# Patient Record
Sex: Male | Born: 1985 | Hispanic: No | Marital: Married | Smoking: Never smoker
Health system: Southern US, Community
[De-identification: ages and names within clinical notes are randomized; demographics above are authoritative.]

## PROBLEM LIST (undated history)

## (undated) DIAGNOSIS — D580 Hereditary spherocytosis: Secondary | ICD-10-CM

## (undated) DIAGNOSIS — I10 Essential (primary) hypertension: Secondary | ICD-10-CM

## (undated) DIAGNOSIS — F419 Anxiety disorder, unspecified: Secondary | ICD-10-CM

## (undated) DIAGNOSIS — E119 Type 2 diabetes mellitus without complications: Secondary | ICD-10-CM

## (undated) DIAGNOSIS — G4730 Sleep apnea, unspecified: Secondary | ICD-10-CM

## (undated) HISTORY — PX: UPPER GASTROINTESTINAL ENDOSCOPY: SHX188

## (undated) HISTORY — DX: Anxiety disorder, unspecified: F41.9

## (undated) HISTORY — DX: Sleep apnea, unspecified: G47.30

## (undated) HISTORY — PX: SPHINCTEROTOMY: SHX5279

## (undated) HISTORY — PX: GASTROPLASTY: SHX192

## (undated) HISTORY — PX: APPENDECTOMY: SHX54

---

## 2013-09-08 ENCOUNTER — Encounter (HOSPITAL_BASED_OUTPATIENT_CLINIC_OR_DEPARTMENT_OTHER): Payer: Self-pay | Admitting: Emergency Medicine

## 2013-09-08 ENCOUNTER — Emergency Department (HOSPITAL_BASED_OUTPATIENT_CLINIC_OR_DEPARTMENT_OTHER)
Admission: EM | Admit: 2013-09-08 | Discharge: 2013-09-08 | Disposition: A | Discharge status: Home / Self Care | Payer: PRIVATE HEALTH INSURANCE | Attending: Emergency Medicine | Admitting: Emergency Medicine

## 2013-09-08 DIAGNOSIS — Z79899 Other long term (current) drug therapy: Secondary | ICD-10-CM | POA: Insufficient documentation

## 2013-09-08 DIAGNOSIS — Z862 Personal history of diseases of the blood and blood-forming organs and certain disorders involving the immune mechanism: Secondary | ICD-10-CM | POA: Insufficient documentation

## 2013-09-08 DIAGNOSIS — K649 Unspecified hemorrhoids: Secondary | ICD-10-CM | POA: Insufficient documentation

## 2013-09-08 DIAGNOSIS — K602 Anal fissure, unspecified: Secondary | ICD-10-CM | POA: Insufficient documentation

## 2013-09-08 DIAGNOSIS — I10 Essential (primary) hypertension: Secondary | ICD-10-CM | POA: Insufficient documentation

## 2013-09-08 HISTORY — DX: Essential (primary) hypertension: I10

## 2013-09-08 HISTORY — DX: Hereditary spherocytosis: D58.0

## 2013-09-08 LAB — URINALYSIS, ROUTINE W REFLEX MICROSCOPIC
Bilirubin Urine: NEGATIVE
Hgb urine dipstick: NEGATIVE
Nitrite: NEGATIVE
Specific Gravity, Urine: 1.004 — ABNORMAL LOW (ref 1.005–1.030)
Urobilinogen, UA: 0.2 mg/dL (ref 0.0–1.0)

## 2013-09-08 LAB — GLUCOSE, CAPILLARY: Glucose-Capillary: 89 mg/dL (ref 70–99)

## 2013-09-08 MED ORDER — OXYCODONE-ACETAMINOPHEN 5-325 MG PO TABS: 1.0000 | ORAL_TABLET | Freq: Four times a day (QID) | ORAL | Status: DC | PRN

## 2013-09-08 MED ORDER — LIDOCAINE-HYDROCORTISONE ACE 3-0.5 % RE KIT: 1.0000 | PACK | Freq: Two times a day (BID) | RECTAL | Status: DC

## 2013-09-08 NOTE — ED Notes (Signed)
Pt reports he hasn't been eating and is unable to have a BM.  Rectal pain is described as severe and unbearable.  He has been using glycerin suppositories and pain medications without relief from pain.  Family remains at bedside.

## 2013-09-08 NOTE — ED Notes (Addendum)
Abdominal pain. Rectal pain. Hx of anal fissure. He is suppose to see a Careers adviser on Dec 16th for same.

## 2013-09-08 NOTE — ED Notes (Signed)
Pt d/c with instructions regarding follow up on Monday for an appt with surgeon.  Pt verbalizes understanding and instructed to return to ED for other symptoms or concerns.

## 2013-09-08 NOTE — ED Provider Notes (Signed)
CSN: [id number]     Arrival date & time 09/08/13  1644 History   First MD Initiated Contact with Patient 09/08/13 1701     Chief Complaint  Patient presents with  . Abdominal Pain   (Consider location/radiation/quality/duration/timing/severity/associated sxs/prior Treatment) HPI Comments: Pt states that he was diagnosed a month ago with an anal fissure:pt states that 2 weeks ago he saw gi and he was told that he needed to see surgery:pt states that he couldn't get in till 12/16 and the pain is worsening:denies fever, vomiting;pt states that he isn't eating much because he doesn't want to have a JX:BJYNWG is adamant that the surgeon needs to be called in today  No language interpreter was used.    Past Medical History  Diagnosis Date  . Hereditary spherocytosis   . Hypertension    Past Surgical History  Procedure Laterality Date  . Appendectomy     No family history on file. History  Substance Use Topics  . Smoking status: Never Smoker   . Smokeless tobacco: Not on file  . Alcohol Use: No    Review of Systems  Constitutional: Negative.   Respiratory: Negative.   Cardiovascular: Negative.     Allergies  Sulfa antibiotics  Home Medications   Current Outpatient Rx  Name  Route  Sig  Dispense  Refill  . telmisartan (MICARDIS) 20 MG tablet   Oral   Take 20 mg by mouth daily.         . Lidocaine-Hydrocortisone Ace 3-0.5 % KIT   Rectal   Place 1 Applicatorful rectally 2 (two) times daily.   10 each   0    BP 127/74  Pulse 78  Temp(Src) 98.2 F (36.8 C)  Resp 20  Ht 5\' 6"  (1.676 m)  Wt 169 lb (76.658 kg)  BMI 27.29 kg/m2  SpO2 100% Physical Exam  Nursing note and vitals reviewed. Constitutional: He is oriented to person, place, and time. He appears well-developed and well-nourished.  Cardiovascular: Normal rate and regular rhythm.   Pulmonary/Chest: Effort normal and breath sounds normal.  Abdominal: Soft. Bowel sounds are normal. There is no tenderness.   Genitourinary:  External exam with deflated hemorrhoids:pt refusing internal exam  Musculoskeletal: Normal range of motion.  Neurological: He is alert and oriented to person, place, and time.  Skin: Skin is warm and dry.    ED Course  Procedures (including critical care time) Labs Review Labs Reviewed  URINALYSIS, ROUTINE W REFLEX MICROSCOPIC - Abnormal; Notable for the following:    Specific Gravity, Urine 1.004 (*)    All other components within normal limits  GLUCOSE, CAPILLARY   Imaging Review No results found.  EKG Interpretation   None       MDM   1. Rectal fissure    Pt given follow up with our surgery  7:06 PM Pt sister is now back after leaving requesting a shot of pain medication:discussed with family that he would need to check in as he left the facility,and it was refused at time of admission:sister is also demanding oral toradol which i told her I don't prescribe:will give a script for pain medication  Teressa Lower, NP 09/08/13 1841  Teressa Lower, NP 09/08/13 1908

## 2013-09-08 NOTE — ED Provider Notes (Signed)
Medical screening examination/treatment/procedure(s) were performed by non-physician practitioner and as supervising physician I was immediately available for consultation/collaboration.  EKG Interpretation   None         Zelma Mazariego, MD 09/08/13 2239 

## 2013-09-18 ENCOUNTER — Ambulatory Visit (INDEPENDENT_AMBULATORY_CARE_PROVIDER_SITE_OTHER): Payer: Self-pay | Admitting: Surgery

## 2013-10-02 ENCOUNTER — Encounter (HOSPITAL_BASED_OUTPATIENT_CLINIC_OR_DEPARTMENT_OTHER): Payer: Self-pay | Admitting: Emergency Medicine

## 2013-10-02 ENCOUNTER — Emergency Department (HOSPITAL_BASED_OUTPATIENT_CLINIC_OR_DEPARTMENT_OTHER): Payer: PRIVATE HEALTH INSURANCE

## 2013-10-02 ENCOUNTER — Emergency Department (HOSPITAL_BASED_OUTPATIENT_CLINIC_OR_DEPARTMENT_OTHER)
Admission: EM | Admit: 2013-10-02 | Discharge: 2013-10-02 | Disposition: A | Discharge status: Home / Self Care | Payer: PRIVATE HEALTH INSURANCE | Attending: Emergency Medicine | Admitting: Emergency Medicine

## 2013-10-02 DIAGNOSIS — W2209XA Striking against other stationary object, initial encounter: Secondary | ICD-10-CM | POA: Insufficient documentation

## 2013-10-02 DIAGNOSIS — S92911A Unspecified fracture of right toe(s), initial encounter for closed fracture: Secondary | ICD-10-CM

## 2013-10-02 DIAGNOSIS — S92919A Unspecified fracture of unspecified toe(s), initial encounter for closed fracture: Secondary | ICD-10-CM | POA: Insufficient documentation

## 2013-10-02 DIAGNOSIS — Y929 Unspecified place or not applicable: Secondary | ICD-10-CM | POA: Insufficient documentation

## 2013-10-02 DIAGNOSIS — I10 Essential (primary) hypertension: Secondary | ICD-10-CM | POA: Insufficient documentation

## 2013-10-02 DIAGNOSIS — Y939 Activity, unspecified: Secondary | ICD-10-CM | POA: Insufficient documentation

## 2013-10-02 DIAGNOSIS — Z862 Personal history of diseases of the blood and blood-forming organs and certain disorders involving the immune mechanism: Secondary | ICD-10-CM | POA: Insufficient documentation

## 2013-10-02 DIAGNOSIS — Z79899 Other long term (current) drug therapy: Secondary | ICD-10-CM | POA: Insufficient documentation

## 2013-10-02 MED ORDER — HYDROCODONE-ACETAMINOPHEN 5-325 MG PO TABS: 2.0000 | ORAL_TABLET | ORAL | Status: DC | PRN

## 2013-10-02 NOTE — ED Provider Notes (Signed)
CSN: [id number]     Arrival date & time 10/02/13  2017 History   First MD Initiated Contact with Patient 10/02/13 2309     Chief Complaint  Patient presents with  . Foot Injury   (Consider location/radiation/quality/duration/timing/severity/associated sxs/prior Treatment) Patient is a 27 y.o. male presenting with foot injury. The history is provided by the patient. No language interpreter was used.  Foot Injury Location:  Foot and toe Toe location:  R little toe Pain details:    Quality:  Aching   Radiates to:  Does not radiate   Timing:  Constant   Progression:  Worsening Chronicity:  New Dislocation: no   Foreign body present:  No foreign bodies Prior injury to area:  Yes Worsened by:  Nothing tried Ineffective treatments:  None tried   Past Medical History  Diagnosis Date  . Hereditary spherocytosis   . Hypertension    Past Surgical History  Procedure Laterality Date  . Appendectomy     No family history on file. History  Substance Use Topics  . Smoking status: Never Smoker   . Smokeless tobacco: Not on file  . Alcohol Use: No    Review of Systems  Musculoskeletal: Positive for joint swelling and myalgias.  All other systems reviewed and are negative.    Allergies  Sulfa antibiotics  Home Medications   Current Outpatient Rx  Name  Route  Sig  Dispense  Refill  . Lidocaine-Hydrocortisone Ace 3-0.5 % KIT   Rectal   Place 1 Applicatorful rectally 2 (two) times daily.   10 each   0   . oxyCODONE-acetaminophen (PERCOCET/ROXICET) 5-325 MG per tablet   Oral   Take 1-2 tablets by mouth every 6 (six) hours as needed for severe pain.   6 tablet   0   . telmisartan (MICARDIS) 20 MG tablet   Oral   Take 20 mg by mouth daily.          BP 138/75  Pulse 95  Temp(Src) 98.7 F (37.1 C) (Oral)  Resp 16  Ht 5\' 6"  (1.676 m)  Wt 169 lb (76.658 kg)  BMI 27.29 kg/m2  SpO2 100% Physical Exam  Nursing note and vitals reviewed. Constitutional: He is  oriented to person, place, and time. He appears well-developed and well-nourished.  HENT:  Head: Normocephalic.  Musculoskeletal: He exhibits tenderness.  Tender right 5th toe  Neurological: He is alert and oriented to person, place, and time. He has normal reflexes.  Skin: Skin is warm.  Psychiatric: He has a normal mood and affect.    ED Course  Procedures (including critical care time) Labs Review Labs Reviewed - No data to display Imaging Review Dg Foot Complete Right  10/02/2013   CLINICAL DATA:  Right 5th toe pain  EXAM: RIGHT FOOT COMPLETE - 3+ VIEW  COMPARISON:  None.  FINDINGS: There is a nondisplaced fracture of the base of the right 5th proximal phalanx. There is no other fracture or dislocation. There is mild soft tissue swelling over the 5th MTP joint.  IMPRESSION: Nondisplaced fracture of the base of the 5th proximal phalanx of the right foot.   Electronically Signed   By: Elige Ko   On: 10/02/2013 22:14    EKG Interpretation   None       MDM   1. Phalanx fracture, foot, right, closed, initial encounter    Buddy tape  Post op shoe,  Hydrocodone    Elson Areas, PA-C 10/02/13 2325

## 2013-10-02 NOTE — ED Notes (Signed)
Kicked wall 12/26-c/o pain to toes 2-5-worse to 5th

## 2013-10-03 NOTE — ED Provider Notes (Signed)
Medical screening examination/treatment/procedure(s) were performed by non-physician practitioner and as supervising physician I was immediately available for consultation/collaboration.  EKG Interpretation   None         Hanley Seamen, MD 10/03/13 [phone]

## 2014-05-11 DIAGNOSIS — D580 Hereditary spherocytosis: Secondary | ICD-10-CM | POA: Insufficient documentation

## 2014-05-11 DIAGNOSIS — I10 Essential (primary) hypertension: Secondary | ICD-10-CM | POA: Insufficient documentation

## 2015-05-23 DIAGNOSIS — R002 Palpitations: Secondary | ICD-10-CM | POA: Insufficient documentation

## 2015-05-24 DIAGNOSIS — R000 Tachycardia, unspecified: Secondary | ICD-10-CM | POA: Insufficient documentation

## 2015-07-05 ENCOUNTER — Encounter (HOSPITAL_BASED_OUTPATIENT_CLINIC_OR_DEPARTMENT_OTHER): Payer: Self-pay | Admitting: *Deleted

## 2015-07-05 ENCOUNTER — Emergency Department (HOSPITAL_BASED_OUTPATIENT_CLINIC_OR_DEPARTMENT_OTHER)
Admission: EM | Admit: 2015-07-05 | Discharge: 2015-07-05 | Disposition: A | Discharge status: Home / Self Care | Payer: Worker's Compensation | Attending: Emergency Medicine | Admitting: Emergency Medicine

## 2015-07-05 DIAGNOSIS — S199XXA Unspecified injury of neck, initial encounter: Secondary | ICD-10-CM | POA: Diagnosis present

## 2015-07-05 DIAGNOSIS — Z79899 Other long term (current) drug therapy: Secondary | ICD-10-CM | POA: Diagnosis not present

## 2015-07-05 DIAGNOSIS — Z862 Personal history of diseases of the blood and blood-forming organs and certain disorders involving the immune mechanism: Secondary | ICD-10-CM | POA: Diagnosis not present

## 2015-07-05 DIAGNOSIS — Y9289 Other specified places as the place of occurrence of the external cause: Secondary | ICD-10-CM | POA: Insufficient documentation

## 2015-07-05 DIAGNOSIS — I10 Essential (primary) hypertension: Secondary | ICD-10-CM | POA: Diagnosis not present

## 2015-07-05 DIAGNOSIS — X58XXXA Exposure to other specified factors, initial encounter: Secondary | ICD-10-CM | POA: Insufficient documentation

## 2015-07-05 DIAGNOSIS — S161XXA Strain of muscle, fascia and tendon at neck level, initial encounter: Secondary | ICD-10-CM | POA: Diagnosis not present

## 2015-07-05 DIAGNOSIS — Y990 Civilian activity done for income or pay: Secondary | ICD-10-CM | POA: Insufficient documentation

## 2015-07-05 DIAGNOSIS — Y9389 Activity, other specified: Secondary | ICD-10-CM | POA: Diagnosis not present

## 2015-07-05 MED ORDER — OXYCODONE-ACETAMINOPHEN 5-325 MG PO TABS: 2.0000 | ORAL_TABLET | ORAL | Status: DC | PRN

## 2015-07-05 MED ORDER — KETOROLAC TROMETHAMINE 30 MG/ML IJ SOLN
60.0000 mg | Freq: Once | INTRAMUSCULAR | Status: AC
  Administered 2015-07-05: 60 mg via INTRAMUSCULAR
  Filled 2015-07-05: qty 2

## 2015-07-05 MED ORDER — CYCLOBENZAPRINE HCL 10 MG PO TABS: 10.0000 mg | ORAL_TABLET | Freq: Two times a day (BID) | ORAL | Status: DC | PRN

## 2015-07-05 NOTE — ED Notes (Signed)
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 C/o pain is greater on right side and hard to turn head. Pt states he passed out this am and woken up by family.

## 2015-07-05 NOTE — Discharge Instructions (Signed)
Take 600 mg of ibuprofen every 6 hours.   Soft Tissue Injury of the Neck A soft tissue injury of the neck may be either blunt or penetrating. A blunt injury does not break the skin. A penetrating injury breaks the skin, creating an open wound. Blunt injuries may happen in several ways. Most involve some type of direct blow to the neck. This can cause serious injury to the windpipe, voice box, cervical spine, or esophagus. In some cases, the injury to the soft tissue can also result in a break (fracture) of the cervical spine.  Soft tissue injuries of the neck require immediate medical care. Sometimes, you may not notice the signs of injury right away. You may feel fine at first, but the swelling may eventually close off your airway. This could result in a significant or life-threatening injury. This is rare, but it is important to keep in mind with any injury to the neck.  CAUSES  Causes of blunt injury may include:  "Clothesline" injuries. This happens when someone is moving at high speed and runs into a clothesline, outstretched arm, or similar object. This results in a direct injury to the front of the neck. If the airway is blocked, it can cause suffocation due to lack of oxygen (asphyxiation) or even instant death.  High-energy trauma. This includes injuries from motor vehicle crashes, falling from a great height, or heavy objects falling onto the neck.  Sports-related injuries. Injury to the windpipe and voice box can result from being struck by another player or being struck by an object, such as a baseball, hockey stick, or an outstretched arm.  Strangulation. This type of injury may cause skin trauma, hoarseness of voice, or broken cartilage in the voice box or windpipe. It may also cause a serious airway problem. SYMPTOMS   Bruising.  Pain and tenderness in the neck.  Swelling of the neck and face.  Hoarseness of voice.  Pain or difficulty with swallowing.  Drooling or inability  to swallow.  Trouble breathing. This may become worse when lying flat.  Coughing up blood.  High-pitched, harsh, vibratory noise due to partial obstruction of the windpipe (stridor).  Swelling of the upper arms.  Windpipe that appears to be pushed off to one side.  Air in the tissues under the skin of the neck or chest (subcutaneous emphysema). This usually indicates a problem with the normal airway and is a medical emergency. DIAGNOSIS   If possible, your caregiver may ask about the details of how the injury occurred. A detailed exam can help to identify specific areas of the neck that are injured.  Your caregiver may ask for tests to rule out injury of the voice box, airway, or esophagus. This may include X-rays, ultrasounds, CT scans, or MRI scans, depending on the severity of your injury. TREATMENT  If you have an injury to your windpipe or voice box, immediate medical care is required. In almost all cases, hospitalization is necessary. For injuries that do not appear to require surgery, it is helpful to have medical observation for 24 hours. You may be asked to do one or more of the following:  Rest your voice.  Bed rest.  Limit your diet, depending on the extent of the injury. Follow your caregiver's dietary guidelines. Often, only fluids and soft foods are recommended.  Keep your head raised.  Breathe humidified air.  Take medicines to control infection, reduce swelling, and reduce normal stomach acid. You may also need pain medicine, depending  on your injury. For injuries that appear to require surgery, you will need to stay in the hospital. The exact type of procedure needed will depend on your exact injury or injuries.  HOME CARE INSTRUCTIONS   If the skin was broken, keep the wound area clean and dry. Wear your bandage (dressing) and care for your wound as instructed.  Follow your caregiver's advice about your diet.  Follow your caregiver's advice about use of your  voice.  Take medicines as directed.  Keep your head and neck at least partially raised (elevated) while recovering. This should also be done while sleeping. SEEK MEDICAL CARE IF:   Your voice becomes weaker.  Your swelling or bruising is not improving as expected. Typically, this takes several days to improve.  You feel that you are having problems with medicines prescribed.  You have drainage from the injury site. This may be a sign that your wound is not healing properly or is infected.  You develop increasing pain or difficulty while swallowing.  You develop an oral temperature of 102 F (38.9 C) or higher. SEEK IMMEDIATE MEDICAL CARE IF:   You cough up blood.  You develop sudden trouble breathing.  You cannot tolerate your oral medicines, or you are unable to swallow.  You develop drooling.  You have new or worsening vomiting.  You develop sudden, new swelling of the neck or face.  You have an oral temperature above 102 F (38.9 C), not controlled by medicine. MAKE SURE YOU:  Understand these instructions.  Will watch your condition.  Will get help right away if you are not doing well or get worse. Document Released: 12/29/2007 Document Revised: 12/14/2011 Document Reviewed: 12/08/2010 [facility] Patient Information 2015 [place], [place]. This information is not intended to replace advice given to you by your health care provider. Make sure you discuss any questions you have with your health care provider.

## 2015-07-05 NOTE — ED Provider Notes (Signed)
CSN: [id number]     Arrival date & time 07/05/15  0813 History   First MD Initiated Contact with Patient 07/05/15 [phone]     Chief Complaint  Patient presents with  . Neck Pain      HPI Patient presents with pain to the right neck area which he thinks occurred when he was lifting someone at work.  The pain is worsened when he turns his head especially to the left.  Patient passed out this morning because the pain.  Patient has had several episodes of syncope related pain during his life and this is not unusual for him.  No recent history of trauma to the neck. Past Medical History  Diagnosis Date  . Hereditary spherocytosis   . Hypertension    Past Surgical History  Procedure Laterality Date  . Appendectomy     No family history on file. Social History  Substance Use Topics  . Smoking status: Never Smoker   . Smokeless tobacco: None  . Alcohol Use: No    Review of Systems  All other systems reviewed and are negative  Allergies  Sulfa antibiotics  Home Medications   Prior to Admission medications   Medication Sig Start Date End Date Taking? Authorizing Provider  telmisartan (MICARDIS) 20 MG tablet Take 20 mg by mouth daily.   Yes Historical Provider, MD  cyclobenzaprine (FLEXERIL) 10 MG tablet Take 1 tablet (10 mg total) by mouth 2 (two) times daily as needed for muscle spasms. 07/05/15   Nelva Nay, MD  oxyCODONE-acetaminophen (PERCOCET/ROXICET) 5-325 MG tablet Take 2 tablets by mouth every 4 (four) hours as needed for severe pain. 07/05/15   Nelva Nay, MD   BP 122/78 mmHg  Pulse 75  Temp(Src) 98.2 F (36.8 C) (Oral)  Resp 18  Ht  (1.676 m)  Wt 171 lb (77.565 kg)  BMI 27.61 kg/m2  SpO2 100% Physical Exam Physical Exam  Nursing note and vitals reviewed. Constitutional: He is oriented to person, place, and time. He appears well-developed and well-nourished. No distress.  HENT:  Head: Normocephalic and atraumatic.  Eyes: Pupils are equal, round, and  reactive to light.  Neck: patient has decreased range of motion with pain to movement and palpation on the right cervical area extending in to the right posterior shoulder.  Auscultation reveals no bruits..  Cardiovascular: Normal rate and intact distal pulses.   Pulmonary/Chest: No respiratory distress.  Abdominal: Normal appearance. He exhibits no distension.  Musculoskeletal: Normal range of motion.  Neurological: He is alert and oriented to person, place, and time. No cranial nerve deficit.  Skin: Skin is warm and dry. No rash noted.  Psychiatric: He has a normal mood and affect. His behavior is normal.   ED Course  Procedures (including critical care time) Labs Review Labs Reviewed - No data to display  Imaging Review No results found. I have personally reviewed and evaluated these images and lab results as part of my medical decision-making.    MDM   Final diagnoses:  Cervical strain, initial encounter        Nelva Nay, MD 07/05/15 [phone]

## 2015-07-06 ENCOUNTER — Emergency Department (HOSPITAL_BASED_OUTPATIENT_CLINIC_OR_DEPARTMENT_OTHER)
Admission: EM | Admit: 2015-07-06 | Discharge: 2015-07-06 | Disposition: A | Discharge status: Home / Self Care | Payer: Worker's Compensation | Attending: Emergency Medicine | Admitting: Emergency Medicine

## 2015-07-06 ENCOUNTER — Encounter (HOSPITAL_BASED_OUTPATIENT_CLINIC_OR_DEPARTMENT_OTHER): Payer: Self-pay | Admitting: Emergency Medicine

## 2015-07-06 DIAGNOSIS — M62838 Other muscle spasm: Secondary | ICD-10-CM | POA: Diagnosis not present

## 2015-07-06 DIAGNOSIS — R202 Paresthesia of skin: Secondary | ICD-10-CM | POA: Diagnosis not present

## 2015-07-06 DIAGNOSIS — Z7952 Long term (current) use of systemic steroids: Secondary | ICD-10-CM | POA: Insufficient documentation

## 2015-07-06 DIAGNOSIS — R200 Anesthesia of skin: Secondary | ICD-10-CM | POA: Insufficient documentation

## 2015-07-06 DIAGNOSIS — M25511 Pain in right shoulder: Secondary | ICD-10-CM | POA: Diagnosis present

## 2015-07-06 DIAGNOSIS — I10 Essential (primary) hypertension: Secondary | ICD-10-CM | POA: Diagnosis not present

## 2015-07-06 DIAGNOSIS — Z862 Personal history of diseases of the blood and blood-forming organs and certain disorders involving the immune mechanism: Secondary | ICD-10-CM | POA: Diagnosis not present

## 2015-07-06 DIAGNOSIS — Z79899 Other long term (current) drug therapy: Secondary | ICD-10-CM | POA: Diagnosis not present

## 2015-07-06 DIAGNOSIS — M542 Cervicalgia: Secondary | ICD-10-CM | POA: Diagnosis not present

## 2015-07-06 MED ORDER — HYDROMORPHONE HCL 1 MG/ML IJ SOLN
1.0000 mg | Freq: Once | INTRAMUSCULAR | Status: DC
  Filled 2015-07-06: qty 1

## 2015-07-06 MED ORDER — DIAZEPAM 5 MG PO TABS: 5.0000 mg | ORAL_TABLET | Freq: Three times a day (TID) | ORAL | Status: DC | PRN

## 2015-07-06 MED ORDER — DIAZEPAM 5 MG PO TABS
5.0000 mg | ORAL_TABLET | Freq: Once | ORAL | Status: AC
  Administered 2015-07-06: 5 mg via ORAL
  Filled 2015-07-06: qty 1

## 2015-07-06 NOTE — Discharge Instructions (Signed)

## 2015-07-06 NOTE — ED Provider Notes (Signed)
CSN: [id number]     Arrival date & time 07/06/15  1955 History  By signing my name below, I, Clarence Hines, attest that this documentation has been prepared under the direction and in the presence of Elwin Mocha, MD. Electronically Signed: Phillis Hines, ED Scribe. 07/06/2015. 9:06 PM.  Chief Complaint  Patient presents with  . Shoulder Pain   Patient is a 29 y.o. male presenting with shoulder pain. The history is provided by the patient. No language interpreter was used.  Shoulder Pain Location:  Shoulder Time since incident:  2 days Injury: yes   Mechanism of injury comment:  Lifting pt at work Shoulder location:  R shoulder Pain details:    Quality:  Sharp and shooting   Radiates to:  R arm   Duration:  2 days Chronicity:  New Dislocation: no   Foreign body present:  No foreign bodies Tetanus status:  Unknown Prior injury to area:  No Ineffective treatments:  Narcotics and muscle relaxant Associated symptoms: decreased range of motion, neck pain and tingling   Associated symptoms: no muscle weakness and no numbness    HPI Comments: Clarence Hines is a 29 y.o. Male with hx of HTN and hereditary spherocytosis who presents to the Emergency Department complaining of radiating, severe right shoulder pain onset one day ago. 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 States that he woke up and then passed out due to the pain. He was seen yesterday and was given Tramadol, Oxycodone, Prednisone and Flexeril to some relief. He states that he went to [facility] after his visit here for a CT scan to be performed. He states that the pain now radiates down his arms and is experiencing tingling in his 3rd, 4th and 5th fingers. Reports relieving pain with turning head to the right, and reports worsening pain with turning head to left. Pt states that he was told to have an MRI performed. Denies fall or other injuries.   Past Medical History  Diagnosis Date  .  Hereditary spherocytosis (HCC)   . Hypertension    Past Surgical History  Procedure Laterality Date  . Appendectomy     History reviewed. No pertinent family history. Social History  Substance Use Topics  . Smoking status: Never Smoker   . Smokeless tobacco: None  . Alcohol Use: No    Review of Systems  Musculoskeletal: Positive for arthralgias and neck pain.  Neurological: Positive for numbness (tingling down right arm). Negative for weakness.  All other systems reviewed and are negative.  Allergies  Sulfa antibiotics  Home Medications   Prior to Admission medications   Medication Sig Start Date End Date Taking? Authorizing Provider  predniSONE (DELTASONE) 10 MG tablet Take 10 mg by mouth daily with breakfast.   Yes Historical Provider, MD  cyclobenzaprine (FLEXERIL) 10 MG tablet Take 1 tablet (10 mg total) by mouth 2 (two) times daily as needed for muscle spasms. 07/05/15   Nelva Nay, MD  oxyCODONE-acetaminophen (PERCOCET/ROXICET) 5-325 MG tablet Take 2 tablets by mouth every 4 (four) hours as needed for severe pain. 07/05/15   Nelva Nay, MD  telmisartan (MICARDIS) 20 MG tablet Take 20 mg by mouth daily.    Historical Provider, MD   BP 154/90 mmHg  Pulse 80  Temp(Src) 98.6 F (37 C) (Oral)  Resp 20  SpO2 100% Physical Exam  Constitutional: He is oriented to person, place, and time. He appears well-developed and well-nourished. No distress.  HENT:  Head:  Normocephalic and atraumatic.  Mouth/Throat: No oropharyngeal exudate.  Eyes: EOM are normal. Pupils are equal, round, and reactive to light.  Neck: Normal range of motion. Neck supple.  Cardiovascular: Normal rate and regular rhythm.  Exam reveals no friction rub.   No murmur heard. Pulmonary/Chest: Effort normal and breath sounds normal. No respiratory distress. He has no wheezes. He has no rales.  Abdominal: He exhibits no distension. There is no tenderness. There is no rebound.  Musculoskeletal: Normal  range of motion. He exhibits no edema.       Back:  Neurological: He is alert and oriented to person, place, and time.  Skin: He is not diaphoretic.    ED Course  Procedures (including critical care time) DIAGNOSTIC STUDIES: Oxygen Saturation is 100% on RA, normal by my interpretation.    COORDINATION OF CARE: 9:19 PM-Discussed treatment plan which includes Valium with pt at bedside and pt agreed to plan.   Labs Review Labs Reviewed - No data to display  Imaging Review No results found.    EKG Interpretation None      MDM   Final diagnoses:  Muscle spasm    29 year old male here with right shoulder pain. He was [facility] yesterday and was told he needed an MRI. He thought he began that MRI here. 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 He's had some numbness and tingling in his right hand. He's neurovascularly intact, he has sensation but there is some mild tingling. On shoulder exam he has severe spasm in his right trapezius extending from the neck all the way out to the lateral edge of the trapezius. Given Valium and Dilaudid with much improving his pain. I do not think he needs an MRI emergently as the spasm is likely causing all the tingling. Stable for discharge.   I personally performed the services described in this documentation, which was scribed in my presence. The recorded information has been reviewed and is accurate.     Elwin Mocha, MD 07/07/15 [phone]

## 2015-07-06 NOTE — ED Notes (Signed)
Family specifically asking about MRI availability/ capability.

## 2015-07-06 NOTE — ED Notes (Signed)
Confirmed/ clarified with pt and family. There are no active orders for this pt here at this facility. They called earlier about MRI, was instructed that MRI staff was present on campus until 1500, pt/family arrived after MRI staff have left. Clarified that there are still no orders for MRI and that it would be up to the doctor to order it, but that MRI is not available at this time. Asking about be transferred. Process explained.

## 2015-07-06 NOTE — ED Notes (Signed)
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

## 2015-07-08 ENCOUNTER — Encounter: Payer: Self-pay | Admitting: Family Medicine

## 2015-07-08 ENCOUNTER — Ambulatory Visit (INDEPENDENT_AMBULATORY_CARE_PROVIDER_SITE_OTHER): Payer: Worker's Compensation | Admitting: Family Medicine

## 2015-07-08 VITALS — BP 140/84 | HR 103 | Ht 66.0 in | Wt 171.0 lb

## 2015-07-08 DIAGNOSIS — S199XXA Unspecified injury of neck, initial encounter: Secondary | ICD-10-CM | POA: Diagnosis not present

## 2015-07-08 NOTE — Patient Instructions (Signed)
You have cervical radiculopathy (a pinched nerve in the neck typically from a disc herniation). Continue your prednisone until it's completed Can take the flexeril for spasms, either the norco or percocet for severe pain We will go ahead with an MRI Consider a cervical collar. Heat 15 minutes at a time 3-4 times a day to help with spasms. Watch head position when on computers, texting, when sleeping in bed - should in line with back to prevent further nerve traction and irritation. I will call you the business day following the MRI to go over results and next steps.

## 2015-07-09 DIAGNOSIS — M5000 Cervical disc disorder with myelopathy, unspecified cervical region: Secondary | ICD-10-CM | POA: Insufficient documentation

## 2015-07-09 DIAGNOSIS — S199XXA Unspecified injury of neck, initial encounter: Secondary | ICD-10-CM | POA: Insufficient documentation

## 2015-07-09 NOTE — Progress Notes (Signed)
PCP: No PCP Per Patient  Subjective:   HPI: Patient is a 29 y.o. male here for neck injury.  Patient reports on 9/29 he lifted a patient and felt pain more on the right side of posterior neck as his right arm and side were jerked forward when patient resisted. Pain initially mild but later that shift pain slowly progressed. Pain level 6/10. Sharp pain associated with numbness into thumb and index fingers. Has been to a couple different EDs - radiographs and CTs negative for acute fracture. Has tried flexeril, ibuprofen, oxycodone, prednisone injection and oral prednisone without much benefit. Weakness in right arm. Difficulty holding items. Right handed. No skin changes, fever.  Past Medical History  Diagnosis Date  . Hereditary spherocytosis (HCC)   . Hypertension     Current Outpatient Prescriptions on File Prior to Visit  Medication Sig Dispense Refill  . cyclobenzaprine (FLEXERIL) 10 MG tablet Take 1 tablet (10 mg total) by mouth 2 (two) times daily as needed for muscle spasms. 20 tablet 0  . oxyCODONE-acetaminophen (PERCOCET/ROXICET) 5-325 MG tablet Take 2 tablets by mouth every 4 (four) hours as needed for severe pain. 20 tablet 0  . predniSONE (DELTASONE) 10 MG tablet Take 10 mg by mouth daily with breakfast.    . telmisartan (MICARDIS) 20 MG tablet Take 20 mg by mouth daily.     No current facility-administered medications on file prior to visit.    Past Surgical History  Procedure Laterality Date  . Appendectomy      Allergies  Allergen Reactions  . Sulfa Antibiotics     unknown    Social History   Social History  . Marital Status: Married    Spouse Name: N/A  . Number of Children: N/A  . Years of Education: N/A   Occupational History  . Not on file.   Social History Main Topics  . Smoking status: Never Smoker   . Smokeless tobacco: Not on file  . Alcohol Use: No  . Drug Use: No  . Sexual Activity: Not on file   Other Topics Concern  . Not on  file   Social History Narrative    No family history on file.  BP 140/84 mmHg  Pulse 103  Ht  (1.676 m)  Wt 171 lb (77.565 kg)  BMI 27.61 kg/m2  Review of Systems: See HPI above.    Objective:  Physical Exam:  Gen: NAD  Neck: No gross deformity, swelling, bruising. TTP right cervical paraspinal region, trapezius.  No midline/bony TTP. FROM neck - pain on flexion and left > right lateral rotation. BUE strength 5/5 except 4+/5 right finger abduction, 5-/5 right wrist extension.   Sensation diminished right index finger Trace right biceps vs 2+ left biceps reflex.  2+ bilateral reflexes in triceps, brachioradialis tendons. Negative spurlings.  Right shoulder: No swelling, ecchymoses.  No gross deformity. No TTP. FROM. Negative Hawkins, Neers. Negative Speeds, Yergasons. Strength 5/5 with empty can and resisted internal/external rotation. Negative apprehension. NV intact distally.    Assessment & Plan:  1. Neck injury - occurred at work.  Consistent with cervical radiculopathy, less likely brachial plexopathy.  Unfortunately despite several conservative measures including prednisone he has not been improving.  He has weakness, decreased reflexes and sensation, and suggestion of C6 or C7 radiculopathy by distribution.  Concern about large disc herniation - will go ahead with MRI to further assess.  Consider ESI vs neurosurgery referral based on results.

## 2015-07-09 NOTE — Assessment & Plan Note (Signed)
occurred at work.  Consistent with cervical radiculopathy, less likely brachial plexopathy.  Unfortunately despite several conservative measures including prednisone he has not been improving.  He has weakness, decreased reflexes and sensation, and suggestion of C6 or C7 radiculopathy by distribution.  Concern about large disc herniation - will go ahead with MRI to further assess.  Consider ESI vs neurosurgery referral based on results.

## 2015-07-09 NOTE — Addendum Note (Signed)
Addended by: Kathi Simpers F on: 07/09/2015 09:15 AM   Modules accepted: Orders

## 2015-07-11 ENCOUNTER — Ambulatory Visit (INDEPENDENT_AMBULATORY_CARE_PROVIDER_SITE_OTHER): Payer: Worker's Compensation | Admitting: Family Medicine

## 2015-07-11 ENCOUNTER — Other Ambulatory Visit: Payer: Self-pay | Admitting: Family Medicine

## 2015-07-11 ENCOUNTER — Encounter: Payer: Self-pay | Admitting: Family Medicine

## 2015-07-11 VITALS — BP 122/85 | HR 85 | Ht 66.0 in | Wt 170.0 lb

## 2015-07-11 DIAGNOSIS — M5412 Radiculopathy, cervical region: Secondary | ICD-10-CM

## 2015-07-11 DIAGNOSIS — S199XXD Unspecified injury of neck, subsequent encounter: Secondary | ICD-10-CM

## 2015-07-11 MED ORDER — TIZANIDINE HCL 4 MG PO TABS: 4.0000 mg | ORAL_TABLET | Freq: Three times a day (TID) | ORAL | Status: DC | PRN

## 2015-07-11 MED ORDER — PREGABALIN 75 MG PO CAPS: 75.0000 mg | ORAL_CAPSULE | Freq: Two times a day (BID) | ORAL | Status: DC

## 2015-07-11 NOTE — Patient Instructions (Signed)
Follow up with me when you return from [place]. Take lyrica twice a day. Zanaflex (tizanidine) up to three times a day for spasms - can make you sleepy. If your pain is more than 50% improved by Monday I would cancel the shot - if it isn't, go ahead with the injection (I suspect you will need to go ahead with the shot).

## 2015-07-13 ENCOUNTER — Emergency Department (HOSPITAL_BASED_OUTPATIENT_CLINIC_OR_DEPARTMENT_OTHER)
Admission: EM | Admit: 2015-07-13 | Discharge: 2015-07-14 | Disposition: A | Discharge status: Home / Self Care | Payer: Worker's Compensation | Attending: Emergency Medicine | Admitting: Emergency Medicine

## 2015-07-13 ENCOUNTER — Encounter (HOSPITAL_BASED_OUTPATIENT_CLINIC_OR_DEPARTMENT_OTHER): Payer: Self-pay | Admitting: Emergency Medicine

## 2015-07-13 DIAGNOSIS — M542 Cervicalgia: Secondary | ICD-10-CM | POA: Diagnosis present

## 2015-07-13 DIAGNOSIS — Z7952 Long term (current) use of systemic steroids: Secondary | ICD-10-CM | POA: Diagnosis not present

## 2015-07-13 DIAGNOSIS — I10 Essential (primary) hypertension: Secondary | ICD-10-CM | POA: Diagnosis not present

## 2015-07-13 DIAGNOSIS — Z862 Personal history of diseases of the blood and blood-forming organs and certain disorders involving the immune mechanism: Secondary | ICD-10-CM | POA: Diagnosis not present

## 2015-07-13 DIAGNOSIS — Z79899 Other long term (current) drug therapy: Secondary | ICD-10-CM | POA: Diagnosis not present

## 2015-07-13 MED ORDER — HYDROMORPHONE HCL 1 MG/ML IJ SOLN
2.0000 mg | Freq: Once | INTRAMUSCULAR | Status: AC
  Administered 2015-07-13: 2 mg via INTRAMUSCULAR
  Filled 2015-07-13: qty 2

## 2015-07-13 MED ORDER — DEXAMETHASONE SODIUM PHOSPHATE 10 MG/ML IJ SOLN
10.0000 mg | Freq: Once | INTRAMUSCULAR | Status: AC
Start: 2015-07-13 — End: 2015-07-13
  Administered 2015-07-13: 10 mg via INTRAMUSCULAR
  Filled 2015-07-13: qty 1

## 2015-07-13 NOTE — ED Provider Notes (Signed)
CSN: [id number]     Arrival date & time 07/13/15  2215 History   First MD Initiated Contact with Patient 07/13/15 2218     Chief Complaint  Patient presents with  . Neck Pain     (Consider location/radiation/quality/duration/timing/severity/associated sxs/prior Treatment) HPI Clarence Hines is a 29 y.o. male history of hypertension comes in for evaluation of neck pain. Patient has had ongoing neck pain since September. He is followed by Dr. Pearletha Forge. Patient reports he has had an MRI done. There is some suspicion that there may be cervical radicular pain. Patient reports neck pain today, worse with movement. He also reports sharp shooting pains running down his right arm. He has taken his home medications of tizanidine and oxycodone without relief. No other numbness or weakness, loss of bowel or bladder function, shortness of breath, chest pain.  Past Medical History  Diagnosis Date  . Hereditary spherocytosis (HCC)   . Hypertension    Past Surgical History  Procedure Laterality Date  . Appendectomy     History reviewed. No pertinent family history. Social History  Substance Use Topics  . Smoking status: Never Smoker   . Smokeless tobacco: None  . Alcohol Use: No    Review of Systems A 10 point review of systems was completed and was negative except for pertinent positives and negatives as mentioned in the history of present illness     Allergies  Sulfa antibiotics  Home Medications   Prior to Admission medications   Medication Sig Start Date End Date Taking? Authorizing Provider  metoprolol tartrate (LOPRESSOR) 25 MG tablet Take 25 mg by mouth. 05/24/15   Historical Provider, MD  predniSONE (DELTASONE) 10 MG tablet Take 10 mg by mouth daily with breakfast.    Historical Provider, MD  pregabalin (LYRICA) 75 MG capsule Take 1 capsule (75 mg total) by mouth 2 (two) times daily. 07/11/15   Lenda Kelp, MD  telmisartan (MICARDIS) 20 MG tablet Take 20 mg by mouth daily.     Historical Provider, MD  tiZANidine (ZANAFLEX) 4 MG tablet Take 1 tablet (4 mg total) by mouth every 8 (eight) hours as needed for muscle spasms. 07/11/15   Lenda Kelp, MD   BP 122/69 mmHg  Pulse 85  Temp(Src) 98.5 F (36.9 C) (Oral)  Resp 20  SpO2 99% Physical Exam  Constitutional: He is oriented to person, place, and time. He appears well-developed and well-nourished.  HENT:  Head: Normocephalic and atraumatic.  Mouth/Throat: Oropharynx is clear and moist.  Eyes: Conjunctivae are normal. Pupils are equal, round, and reactive to light. Right eye exhibits no discharge. Left eye exhibits no discharge. No scleral icterus.  Neck: Neck supple.  Discomfort to lower cervical spine, worse with head rotation to the left.   Cardiovascular: Normal rate, regular rhythm and normal heart sounds.   Pulmonary/Chest: Effort normal and breath sounds normal. No respiratory distress. He has no wheezes. He has no rales.  Abdominal: Soft. There is no tenderness.  Musculoskeletal: He exhibits no tenderness.  After analgesia. Patient maintains full active range of motion of cervical spine. Moves all extremities without ataxia. Cranial nerves are grossly intact.  Neurological: He is alert and oriented to person, place, and time.  Cranial Nerves II-XII grossly intact. Moves all extremities without ataxia. Motor strength is intact. Sensation intact to light touch.  Skin: Skin is warm and dry. No rash noted.  Psychiatric: He has a normal mood and affect.  Nursing note and vitals reviewed.   ED Course  Procedures (including critical care time) Labs Review Labs Reviewed - No data to display  Imaging Review No results found. I have personally reviewed and evaluated these images and lab results as part of my medical decision-making.   EKG Interpretation None     Mr. Ceasar Mons Vitals:   07/13/15 2220 07/14/15 0000  BP: 143/99 122/69  Pulse: 93 85  Temp: 98.5 F (36.9 C)   TempSrc: Oral   Resp: 20    SpO2: 100% 99%   Meds given in ED:  Medications  HYDROmorphone (DILAUDID) injection 2 mg (2 mg Intramuscular Given 07/13/15 2317)  dexamethasone (DECADRON) injection 10 mg (10 mg Intramuscular Given 07/13/15 2317)    Discharge Medication List as of 07/13/2015 11:38 PM      MDM  Vitals stable - WNL -afebrile Pt resting comfortably in ED. After analgesia, patient has full active range of motion of cervical spine. PE--patient has some paresthesias/sharp pains and right arm but remains neurovascularly intact. Imaging--patient obtained outpatient MRI at [facility] on 07/09/2015 and shows multilevel small protrusions and disc spurs with posterior lateral retropulsion and extrusion at C5-C6 filling the right neural foramen and likely impinging on exiting right C6 root   DDX--no evidence of other acute or emergent pathology at this time. Patient will be able to follow-up with Dr. Pearletha Forge for further evaluation and management of symptoms. Patient reports relief after analgesia in the ED. Also given IM Decadron. Encouraged continued use of home pain medicines. Overall appears well, nontoxic and is appropriate for discharge. I discussed all relevant lab findings and imaging results with pt and they verbalized understanding. Discussed f/u with PCP within 48 hrs and return precautions, pt very amenable to plan.  Final diagnoses:  Neck pain        Clarence Peek, PA-C 07/15/15 0020  Rolland Porter, MD 07/17/15 [phone]

## 2015-07-13 NOTE — ED Notes (Signed)
Patient reports that he can not hold up his neck and he numbness and tingling at the thumb and index finger. Patient reports that he can not move and that the pain is getting worse

## 2015-07-13 NOTE — ED Notes (Signed)
Alert, NAD, calm, interactive, meds given, family at [facility] x2, EDPA in to see pt, at [facility].

## 2015-07-13 NOTE — Discharge Instructions (Signed)
Please follow-up with your doctor for further evaluation and management of your symptoms. Your symptoms are likely due to a bulging disc in your neck. Return to ED for worsening symptoms.  Radicular Pain Radicular pain in either the arm or leg is usually from a bulging or herniated disk in the spine. A piece of the herniated disk may press against the nerves as the nerves exit the spine. This causes pain which is felt at the tips of the nerves down the arm or leg. Other causes of radicular pain may include:  Fractures.  Heart disease.  Cancer.  An abnormal and usually degenerative state of the nervous system or nerves (neuropathy). Diagnosis may require CT or MRI scanning to determine the primary cause.  Nerves that start at the neck (nerve roots) may cause radicular pain in the outer shoulder and arm. It can spread down to the thumb and fingers. The symptoms vary depending on which nerve root has been affected. In most cases radicular pain improves with conservative treatment. Neck problems may require physical therapy, a neck collar, or cervical traction. Treatment may take many weeks, and surgery may be considered if the symptoms do not improve.  Conservative treatment is also recommended for sciatica. Sciatica causes pain to radiate from the lower back or buttock area down the leg into the foot. Often there is a history of back problems. Most patients with sciatica are better after 2 to 4 weeks of rest and other supportive care. Short term bed rest can reduce the disk pressure considerably. Sitting, however, is not a good position since this increases the pressure on the disk. You should avoid bending, lifting, and all other activities which make the problem worse. Traction can be used in severe cases. Surgery is usually reserved for patients who do not improve within the first months of treatment. Only take over-the-counter or prescription medicines for pain, discomfort, or fever as directed by  your caregiver. Narcotics and muscle relaxants may help by relieving more severe pain and spasm and by providing mild sedation. Cold or massage can give significant relief. Spinal manipulation is not recommended. It can increase the degree of disc protrusion. Epidural steroid injections are often effective treatment for radicular pain. These injections deliver medicine to the spinal nerve in the space between the protective covering of the spinal cord and back bones (vertebrae). Your caregiver can give you more information about steroid injections. These injections are most effective when given within two weeks of the onset of pain.  You should see your caregiver for follow up care as recommended. A program for neck and back injury rehabilitation with stretching and strengthening exercises is an important part of management.  SEEK IMMEDIATE MEDICAL CARE IF:  You develop increased pain, weakness, or numbness in your arm or leg.  You develop difficulty with bladder or bowel control.  You develop abdominal pain.   This information is not intended to replace advice given to you by your health care provider. Make sure you discuss any questions you have with your health care provider.   Document Released: 10/29/2004 Document Revised: 10/12/2014 Document Reviewed: 04/17/2015 Elsevier Interactive Patient Education [employer]

## 2015-07-15 ENCOUNTER — Ambulatory Visit
Admission: RE | Admit: 2015-07-15 | Discharge: 2015-07-15 | Disposition: A | Discharge status: Home / Self Care | Payer: 59 | Source: Ambulatory Visit | Attending: Family Medicine | Admitting: Family Medicine

## 2015-07-15 ENCOUNTER — Telehealth: Payer: Self-pay | Admitting: Family Medicine

## 2015-07-15 DIAGNOSIS — M5412 Radiculopathy, cervical region: Secondary | ICD-10-CM

## 2015-07-15 MED ORDER — TRIAMCINOLONE ACETONIDE 40 MG/ML IJ SUSP (RADIOLOGY)
60.0000 mg | Freq: Once | INTRAMUSCULAR | Status: AC
  Administered 2015-07-15: 60 mg via EPIDURAL

## 2015-07-15 MED ORDER — IOHEXOL 300 MG/ML  SOLN
1.0000 mL | Freq: Once | INTRAMUSCULAR | Status: DC | PRN
  Administered 2015-07-15: 1 mL via EPIDURAL

## 2015-07-15 MED ORDER — OXYCODONE-ACETAMINOPHEN 5-325 MG PO TABS: 1.0000 | ORAL_TABLET | Freq: Four times a day (QID) | ORAL | Status: DC | PRN

## 2015-07-15 NOTE — Telephone Encounter (Signed)
Spoke to patient and told him that prescription was up front.

## 2015-07-15 NOTE — Assessment & Plan Note (Addendum)
occurred at work.  MRI has multilevel small protrusions but most concerning is C5-6 level where he has a protrusion and extrusion of disc filling right neural foramen likely irritating C6 nerve root.  This fits with his previous examination.  We discussed options, answered questions and half of 10 minute visit spent on counseling moving forward.  He will start with lyrica twice a day, zanaflex as needed.  ESI scheduled for Monday.  He is planning to travel to [place] for 3 weeks and as of now still plans to go.  Consider repeat ESIs, neurosurgery referral, PT depending on how he does.  Follow up with me when he returns from [place].

## 2015-07-15 NOTE — Telephone Encounter (Signed)
Printed for the patient, will be placed up front.  Thanks!

## 2015-07-15 NOTE — Discharge Instructions (Signed)

## 2015-07-15 NOTE — Telephone Encounter (Signed)
Spoke to patient and he stated that he would like to get a prescription for pain medication (Percocet 5-325mg ). Told him that the prescription would have to be picked up from the office.

## 2015-07-15 NOTE — Progress Notes (Signed)
PCP: No PCP Per Patient  Subjective:   HPI: Patient is a 29 y.o. male here for neck injury.  10/3: Patient reports on 9/29 he lifted a patient and felt pain more on the right side of posterior neck as his right arm and side were jerked forward when patient resisted. Pain initially mild but later that shift pain slowly progressed. Pain level 6/10. Sharp pain associated with numbness into thumb and index fingers. Has been to a couple different EDs - radiographs and CTs negative for acute fracture. Has tried flexeril, ibuprofen, oxycodone, prednisone injection and oral prednisone without much benefit. Weakness in right arm. Difficulty holding items. Right handed. No skin changes, fever.  10/6: Patient returns with questions and for MRI results. Pain continues at 6/10 level, worse at night. Neck and into right arm. Continues to have numbness, sharp pain. Still cannot sleep.  Past Medical History  Diagnosis Date  . Hereditary spherocytosis (HCC)   . Hypertension     Current Outpatient Prescriptions on File Prior to Visit  Medication Sig Dispense Refill  . metoprolol tartrate (LOPRESSOR) 25 MG tablet Take 25 mg by mouth.    . predniSONE (DELTASONE) 10 MG tablet Take 10 mg by mouth daily with breakfast.    . telmisartan (MICARDIS) 20 MG tablet Take 20 mg by mouth daily.     No current facility-administered medications on file prior to visit.    Past Surgical History  Procedure Laterality Date  . Appendectomy      Allergies  Allergen Reactions  . Sulfa Antibiotics     unknown    Social History   Social History  . Marital Status: Married    Spouse Name: N/A  . Number of Children: N/A  . Years of Education: N/A   Occupational History  . Not on file.   Social History Main Topics  . Smoking status: Never Smoker   . Smokeless tobacco: Not on file  . Alcohol Use: No  . Drug Use: No  . Sexual Activity: Not on file   Other Topics Concern  . Not on file   Social  History Narrative    No family history on file.  BP 122/85 mmHg  Pulse 85  Ht  (1.676 m)  Wt 170 lb (77.111 kg)  BMI 27.45 kg/m2  Review of Systems: See HPI above.    Objective:  Physical Exam:  Gen: NAD  Neck and shoulder exam not repeated today. Neck: No gross deformity, swelling, bruising. TTP right cervical paraspinal region, trapezius.  No midline/bony TTP. FROM neck - pain on flexion and left > right lateral rotation. BUE strength 5/5 except 4+/5 right finger abduction, 5-/5 right wrist extension.   Sensation diminished right index finger Trace right biceps vs 2+ left biceps reflex.  2+ bilateral reflexes in triceps, brachioradialis tendons. Negative spurlings.  Right shoulder: No swelling, ecchymoses.  No gross deformity. No TTP. FROM. Negative Hawkins, Neers. Negative Speeds, Yergasons. Strength 5/5 with empty can and resisted internal/external rotation. Negative apprehension. NV intact distally.    Assessment & Plan:  1. Neck injury - occurred at work.  MRI has multilevel small protrusions but most concerning is C5-6 level where he has a protrusion and extrusion of disc filling right neural foramen likely irritating C6 nerve root.  This fits with his previous examination.  We discussed options, answered questions and half of 10 minute visit spent on counseling moving forward.  He will start with lyrica twice a day, zanaflex as needed.  ESI scheduled for Monday.  He is planning to travel to [place] for 3 weeks and as of now still plans to go.  Consider repeat ESIs, neurosurgery referral, PT depending on how he does.  Follow up with me when he returns from [place].

## 2015-07-16 ENCOUNTER — Telehealth: Payer: Self-pay | Admitting: Family Medicine

## 2015-07-16 NOTE — Telephone Encounter (Signed)
No restrictions on his traveling.  I hope he feels better and has a good flight!  Just see me when he returns.

## 2015-07-16 NOTE — Telephone Encounter (Signed)
Spoke to patient and told him that there were no restrictions for traveling and to contact our office when he returns.

## 2015-07-17 ENCOUNTER — Encounter: Payer: Self-pay | Admitting: Family Medicine

## 2015-08-14 ENCOUNTER — Encounter: Payer: Self-pay | Admitting: Family Medicine

## 2015-08-14 ENCOUNTER — Ambulatory Visit (INDEPENDENT_AMBULATORY_CARE_PROVIDER_SITE_OTHER): Payer: Worker's Compensation | Admitting: Family Medicine

## 2015-08-14 ENCOUNTER — Encounter (INDEPENDENT_AMBULATORY_CARE_PROVIDER_SITE_OTHER): Payer: Self-pay

## 2015-08-14 VITALS — BP 126/86 | HR 98 | Ht 66.0 in | Wt 165.0 lb

## 2015-08-14 DIAGNOSIS — S199XXD Unspecified injury of neck, subsequent encounter: Secondary | ICD-10-CM | POA: Diagnosis not present

## 2015-08-14 NOTE — Patient Instructions (Signed)
The numbness may take several months to resolve but I expect it to do so. Continue using the traction unit as long as you see benefit. Call me if you have any problems otherwise follow up with me as needed.

## 2015-08-15 NOTE — Progress Notes (Signed)
PCP: No PCP Per Patient  Subjective:   HPI: Patient is a 29 y.o. male here for neck injury.  10/3: Patient reports on 9/29 he lifted a patient and felt pain more on the right side of posterior neck as his right arm and side were jerked forward when patient resisted. Pain initially mild but later that shift pain slowly progressed. Pain level 6/10. Sharp pain associated with numbness into thumb and index fingers. Has been to a couple different EDs - radiographs and CTs negative for acute fracture. Has tried flexeril, ibuprofen, oxycodone, prednisone injection and oral prednisone without much benefit. Weakness in right arm. Difficulty holding items. Right handed. No skin changes, fever.  10/6: Patient returns with questions and for MRI results. Pain continues at 6/10 level, worse at night. Neck and into right arm. Continues to have numbness, sharp pain. Still cannot sleep.  11/9: Patient reports he's doing well. Pain currently 0/10. Has a little soreness at times right side of neck. Was taking medicines up until about 10 days ago. States first week of his trip was difficult but much better since then. Still tingling in right index finger and thumb. No bowel/bladder dysfunction.  Past Medical History  Diagnosis Date  . Hereditary spherocytosis (HCC)   . Hypertension     Current Outpatient Prescriptions on File Prior to Visit  Medication Sig Dispense Refill  . metoprolol tartrate (LOPRESSOR) 25 MG tablet Take 25 mg by mouth.    . oxyCODONE-acetaminophen (PERCOCET/ROXICET) 5-325 MG tablet Take 1 tablet by mouth every 6 (six) hours as needed for severe pain. 40 tablet 0  . predniSONE (DELTASONE) 10 MG tablet Take 10 mg by mouth daily with breakfast.    . pregabalin (LYRICA) 75 MG capsule Take 1 capsule (75 mg total) by mouth 2 (two) times daily. 60 capsule 1  . telmisartan (MICARDIS) 20 MG tablet Take 20 mg by mouth daily.    Marland Kitchen tiZANidine (ZANAFLEX) 4 MG tablet Take 1 tablet  (4 mg total) by mouth every 8 (eight) hours as needed for muscle spasms. 90 tablet 1   No current facility-administered medications on file prior to visit.    Past Surgical History  Procedure Laterality Date  . Appendectomy      Allergies  Allergen Reactions  . Sulfa Antibiotics     unknown    Social History   Social History  . Marital Status: Married    Spouse Name: N/A  . Number of Children: N/A  . Years of Education: N/A   Occupational History  . Not on file.   Social History Main Topics  . Smoking status: Never Smoker   . Smokeless tobacco: Not on file  . Alcohol Use: No  . Drug Use: No  . Sexual Activity: Not on file   Other Topics Concern  . Not on file   Social History Narrative    No family history on file.  BP 126/86 mmHg  Pulse 98  Ht  (1.676 m)  Wt 165 lb (74.844 kg)  BMI 26.64 kg/m2  Review of Systems: See HPI above.    Objective:  Physical Exam:  Gen: NAD  Neck: No gross deformity, swelling, bruising. Minimal TTP right cervical paraspinal region, trapezius.  No midline/bony TTP. FROM neck without pain. BUE strength 5/5.   Sensation diminished right index finger, thumb. Trace right biceps vs 2+ left biceps reflex.  2+ bilateral reflexes in triceps, brachioradialis tendons. Negative spurlings.  Right shoulder: No swelling, ecchymoses.  No gross deformity.  No TTP. FROM. Negative Hawkins, Neers. Negative Speeds, Yergasons. Strength 5/5 with empty can and resisted internal/external rotation. Negative apprehension. NV intact distally.    Assessment & Plan:  1. Neck injury - occurred at work.  MRI has multilevel small protrusions but most concerning is C5-6 level where he has a protrusion and extrusion of disc filling right neural foramen likely irritating C6 nerve root.  Clinically improving following ESI, having taken lyrica as well.  Continue with traction unit.  Call if he has any problems.  Follow up as needed otherwise.

## 2015-08-15 NOTE — Assessment & Plan Note (Signed)
occurred at work.  MRI has multilevel small protrusions but most concerning is C5-6 level where he has a protrusion and extrusion of disc filling right neural foramen likely irritating C6 nerve root.  Clinically improving following ESI, having taken lyrica as well.  Continue with traction unit.  Call if he has any problems.  Follow up as needed otherwise.

## 2015-12-29 DIAGNOSIS — K7689 Other specified diseases of liver: Secondary | ICD-10-CM | POA: Insufficient documentation

## 2015-12-29 DIAGNOSIS — R161 Splenomegaly, not elsewhere classified: Secondary | ICD-10-CM | POA: Insufficient documentation

## 2016-01-09 DIAGNOSIS — K602 Anal fissure, unspecified: Secondary | ICD-10-CM | POA: Insufficient documentation

## 2016-04-30 ENCOUNTER — Emergency Department (HOSPITAL_BASED_OUTPATIENT_CLINIC_OR_DEPARTMENT_OTHER)
Admission: EM | Admit: 2016-04-30 | Discharge: 2016-04-30 | Disposition: A | Discharge status: Other Acute Inpatient Hospital | Payer: 59 | Attending: Emergency Medicine | Admitting: Emergency Medicine

## 2016-04-30 ENCOUNTER — Encounter (HOSPITAL_BASED_OUTPATIENT_CLINIC_OR_DEPARTMENT_OTHER): Payer: Self-pay | Admitting: Emergency Medicine

## 2016-04-30 ENCOUNTER — Emergency Department (HOSPITAL_COMMUNITY): Payer: 59

## 2016-04-30 DIAGNOSIS — N50811 Right testicular pain: Secondary | ICD-10-CM | POA: Insufficient documentation

## 2016-04-30 DIAGNOSIS — R109 Unspecified abdominal pain: Secondary | ICD-10-CM

## 2016-04-30 DIAGNOSIS — Z79899 Other long term (current) drug therapy: Secondary | ICD-10-CM | POA: Insufficient documentation

## 2016-04-30 DIAGNOSIS — N5082 Scrotal pain: Secondary | ICD-10-CM

## 2016-04-30 DIAGNOSIS — I10 Essential (primary) hypertension: Secondary | ICD-10-CM | POA: Diagnosis not present

## 2016-04-30 DIAGNOSIS — R1031 Right lower quadrant pain: Secondary | ICD-10-CM | POA: Diagnosis present

## 2016-04-30 DIAGNOSIS — D580 Hereditary spherocytosis: Secondary | ICD-10-CM

## 2016-04-30 DIAGNOSIS — N50819 Testicular pain, unspecified: Secondary | ICD-10-CM

## 2016-04-30 LAB — CBC WITH DIFFERENTIAL/PLATELET
Basophils Absolute: 0.1 10*3/uL (ref 0.0–0.1)
Basophils Relative: 1 %
EOS ABS: 0.2 10*3/uL (ref 0.0–0.7)
EOS PCT: 1 %
HEMATOCRIT: 35.5 % — AB (ref 39.0–52.0)
Hemoglobin: 12.1 g/dL — ABNORMAL LOW (ref 13.0–17.0)
LYMPHS PCT: 26 %
Lymphs Abs: 3.2 10*3/uL (ref 0.7–4.0)
MCH: 28.3 pg (ref 26.0–34.0)
MCHC: 34.1 g/dL (ref 30.0–36.0)
MCV: 83.1 fL (ref 78.0–100.0)
MONOS PCT: 7 %
Monocytes Absolute: 0.8 10*3/uL (ref 0.1–1.0)
Neutro Abs: 8 10*3/uL — ABNORMAL HIGH (ref 1.7–7.7)
Neutrophils Relative %: 65 %
Platelets: 191 10*3/uL (ref 150–400)
RBC: 4.27 MIL/uL (ref 4.22–5.81)
RDW: 22.4 % — ABNORMAL HIGH (ref 11.5–15.5)
WBC: 12.3 10*3/uL — ABNORMAL HIGH (ref 4.0–10.5)

## 2016-04-30 LAB — URINALYSIS, ROUTINE W REFLEX MICROSCOPIC
BILIRUBIN URINE: NEGATIVE
GLUCOSE, UA: NEGATIVE mg/dL
Hgb urine dipstick: NEGATIVE
KETONES UR: NEGATIVE mg/dL
Leukocytes, UA: NEGATIVE
NITRITE: NEGATIVE
PH: 5.5 (ref 5.0–8.0)
PROTEIN: NEGATIVE mg/dL
Specific Gravity, Urine: 1.007 (ref 1.005–1.030)

## 2016-04-30 LAB — BASIC METABOLIC PANEL
Anion gap: 6 (ref 5–15)
BUN: 13 mg/dL (ref 6–20)
CHLORIDE: 107 mmol/L (ref 101–111)
CO2: 25 mmol/L (ref 22–32)
CREATININE: 0.78 mg/dL (ref 0.61–1.24)
Calcium: 9.3 mg/dL (ref 8.9–10.3)
GFR calc non Af Amer: >60 mL/min (ref >60)
Glucose, Bld: 115 mg/dL — ABNORMAL HIGH (ref 65–99)
Potassium: 3.7 mmol/L (ref 3.5–5.1)
SODIUM: 138 mmol/L (ref 135–145)

## 2016-04-30 MED ORDER — METOCLOPRAMIDE HCL 10 MG PO TABS: 10.0000 mg | ORAL_TABLET | Freq: Four times a day (QID) | ORAL | 0 refills | Status: DC | PRN

## 2016-04-30 MED ORDER — OXYCODONE-ACETAMINOPHEN 5-325 MG PO TABS: 1.0000 | ORAL_TABLET | Freq: Four times a day (QID) | ORAL | 0 refills | Status: DC | PRN

## 2016-04-30 NOTE — ED Provider Notes (Signed)
[facility] Provider Note   CSN: [id number] Arrival date & time: 04/30/16  8295  First Provider Contact:  First MD Initiated Contact with Patient 04/30/16 [phone]        History   Chief Complaint Chief Complaint  Patient presents with  . Abdominal Pain    HPI Clarence Hines is a 30 y.o. male.  Abdominal Pain     Pt comes in with RLQ abd pain. Pt has hx of appendectomy and spherocytosis. He reports that he woke up in the middle of the night to go the bathroom. Suddenly, he had severe RLQ abd pain radiating to the back. Pt's pain was excruciating, he had nausea, diaphoresis and felt like he might faint. He called out family, who called 911 and the pain was constant and severe. However, pain started getting better, so they cancelled ambulance request and decided to come to the ER. The pain is throbbing and constant. Currently pain is 2/10. Pt denies any hx of similar pain recently. He also denies any hx of renal stones or uti like symptoms.    ROS 10 Systems reviewed and are negative for acute change except as noted in the HPI.     Past Medical History:  Diagnosis Date  . Hereditary spherocytosis (HCC)   . Hypertension     Patient Active Problem List   Diagnosis Date Noted  . Neck injury 07/09/2015  . Sinus tachycardia (HCC) 05/24/2015  . Awareness of heartbeats 05/23/2015  . BP (high blood pressure) 05/11/2014  . Hereditary spherocytosis (HCC) 05/11/2014    Past Surgical History:  Procedure Laterality Date  . APPENDECTOMY         Home Medications    Prior to Admission medications   Medication Sig Start Date End Date Taking? Authorizing Provider  metoprolol tartrate (LOPRESSOR) 25 MG tablet Take 25 mg by mouth. 05/24/15   Historical Provider, MD  oxyCODONE-acetaminophen (PERCOCET/ROXICET) 5-325 MG tablet Take 1 tablet by mouth every 6 (six) hours as needed for severe pain. 07/15/15   Lenda Kelp, MD  predniSONE (DELTASONE) 10 MG tablet Take  10 mg by mouth daily with breakfast.    Historical Provider, MD  pregabalin (LYRICA) 75 MG capsule Take 1 capsule (75 mg total) by mouth 2 (two) times daily. 07/11/15   Lenda Kelp, MD  telmisartan (MICARDIS) 20 MG tablet Take 20 mg by mouth daily.    Historical Provider, MD  tiZANidine (ZANAFLEX) 4 MG tablet Take 1 tablet (4 mg total) by mouth every 8 (eight) hours as needed for muscle spasms. 07/11/15   Lenda Kelp, MD    Family History History reviewed. No pertinent family history.  Social History Social History  Substance Use Topics  . Smoking status: Never Smoker  . Smokeless tobacco: Never Used  . Alcohol use No     Allergies   Sulfa antibiotics   Review of Systems Review of Systems  Gastrointestinal: Positive for abdominal pain.     Physical Exam Updated Vital Signs BP 124/77 (BP Location: Right Arm)   Pulse 74   Temp 97.5 F (36.4 C)   Resp 18   SpO2 100%   Physical Exam  Constitutional: He is oriented to person, place, and time. He appears well-developed.  HENT:  Head: Normocephalic and atraumatic.  Eyes: Conjunctivae and EOM are normal. Pupils are equal, round, and reactive to light.  Neck: Normal range of motion. Neck supple.  Cardiovascular: Normal rate, regular rhythm and normal heart sounds.   Pulmonary/Chest:  Effort normal and breath sounds normal. No respiratory distress. He has no wheezes.  Abdominal: Soft. Bowel sounds are normal. He exhibits no mass. There is tenderness. There is no rebound and no guarding. No hernia.  Right perineal tenderness  Genitourinary: Penis normal.  Genitourinary Comments: Pt has no scrotal edema or rash. The R testes is high riding. Pt has no inguinal hernia appreciated on exam, but the R hemiscrotum is tender to palpation. The testes has descended. Exam of testes is limited due to tenderness, but there was no mass appreciated. There appears to be some edema around the testes.  Neurological: He is alert and oriented  to person, place, and time.  Skin: Skin is warm.  Nursing note and vitals reviewed.    ED Treatments / Results  Labs (all labs ordered are listed, but only abnormal results are displayed) Labs Reviewed  CBC WITH DIFFERENTIAL/PLATELET  BASIC METABOLIC PANEL    EKG  EKG Interpretation None       Radiology No results found.  Procedures Procedures (including critical care time)  Medications Ordered in ED Medications - No data to display   Initial Impression / Assessment and Plan / ED Course  I have reviewed the triage vital signs and the nursing notes.  Pertinent labs & imaging results that were available during my care of the patient were reviewed by me and considered in my medical decision making (see chart for details).  Clinical Course   DDx includes:  Testicular torsion Epididymitis Hydrocoele Inguinal hernia Torsion of the appendix testis  Pt with acute abd pain with near syncope, nausea, diaphoresis. Abd exam is non peritoneal. Pt's testicular exam reveals the likely source of the pain to be scrotal. I didn't appreciate any hernia on my exam. Testes are descended, but there is fair amount of edema around the testes and it was hard to see if the testes is freely moving due to the tenderness. Clinical concerns are for possible testicular torsion/detoirsion vs. Hernia vs. Epididymitis.   Transfer to [facility]. I have spoken with Dr. Preston Fleeting, the [place] tech about patient arriving there for an emergent [place]. If the [place] is neg, and patient remains comfortable, I have asked pt to see pcp or urologist. If [place] is neg and pain gets worse again - consider repeat assessment for torsion vs. CT abd/pelvis.  Final Clinical Impressions(s) / ED Diagnoses   Final diagnoses:  Scrotal pain  Testicle pain  Testicle pain    New Prescriptions New Prescriptions   No medications on file     Derwood Kaplan, MD 04/30/16 [phone]

## 2016-04-30 NOTE — ED Provider Notes (Signed)
30 year old male with history of kidney stones and hereditary spherocytosis was transferred from [facility] for scrotal ultrasound. He had been awakened by severe pain in the right flank with radiation to the right testicle. He is in no distress when he arrived here. Ultrasound has been obtained showing no evidence of epididymitis or testicular torsion. He was reexamined and found a very mild tenderness in the right lower abdomen. cva tenderness. I question whether he may have passed a kidney stone. Records are reviewed on care everywhere and he had a CT of his abdomen and pelvis in March showing no evidence of urolithiasis. He is concerned about possible gallstones given his history is spherocytosis, but no gallstones were seen on the CT scan. He is discharged with prescription for oxycodone have acetaminophen and metoclopramide. He is requesting a referral to gastroenterology because of recurrent problems with heartburn and indigestion. He is referred to on-call gastroenterology for follow-up.  Results for orders placed or performed during the hospital encounter of 04/30/16  CBC with Differential/Platelet  Result Value Ref Range   WBC 12.3 (H) 4.0 - 10.5 K/uL   RBC 4.27 4.22 - 5.81 MIL/uL   Hemoglobin 12.1 (L) 13.0 - 17.0 g/dL   HCT 26.3 (L) 78.5 - 88.5 %   MCV 83.1 78.0 - 100.0 fL   MCH 28.3 26.0 - 34.0 pg   MCHC 34.1 30.0 - 36.0 g/dL   RDW 02.7 (H) 74.1 - 28.7 %   Platelets 191 150 - 400 K/uL   Neutrophils Relative % 65 %   Neutro Abs 8.0 (H) 1.7 - 7.7 K/uL   Lymphocytes Relative 26 %   Lymphs Abs 3.2 0.7 - 4.0 K/uL   Monocytes Relative 7 %   Monocytes Absolute 0.8 0.1 - 1.0 K/uL   Eosinophils Relative 1 %   Eosinophils Absolute 0.2 0.0 - 0.7 K/uL   Basophils Relative 1 %   Basophils Absolute 0.1 0.0 - 0.1 K/uL   WBC Morphology WHITE COUNT CONFIRMED ON SMEAR    RBC Morphology TEARDROP CELLS    Smear Review PLATELET COUNT CONFIRMED BY SMEAR   Basic metabolic panel  Result  Value Ref Range   Sodium 138 135 - 145 mmol/L   Potassium 3.7 3.5 - 5.1 mmol/L   Chloride 107 101 - 111 mmol/L   CO2 25 22 - 32 mmol/L   Glucose, Bld 115 (H) 65 - 99 mg/dL   BUN 13 6 - 20 mg/dL   Creatinine, Ser 8.67 0.61 - 1.24 mg/dL   Calcium 9.3 8.9 - 67.2 mg/dL   GFR calc non Af Amer >60 >60 mL/min   GFR calc Af Amer >60 >60 mL/min   Anion gap 6 5 - 15  Urinalysis, Routine w reflex microscopic  Result Value Ref Range   Color, Urine YELLOW YELLOW   APPearance CLEAR CLEAR   Specific Gravity, Urine 1.007 1.005 - 1.030   pH 5.5 5.0 - 8.0   Glucose, UA NEGATIVE NEGATIVE mg/dL   Hgb urine dipstick NEGATIVE NEGATIVE   Bilirubin Urine NEGATIVE NEGATIVE   Ketones, ur NEGATIVE NEGATIVE mg/dL   Protein, ur NEGATIVE NEGATIVE mg/dL   Nitrite NEGATIVE NEGATIVE   Leukocytes, UA NEGATIVE NEGATIVE   US Scrotum  Result Date: 04/30/2016 CLINICAL DATA:  30 year old male with sudden onset abdominal pain radiating to the testicles. EXAM: SCROTAL ULTRASOUND DOPPLER ULTRASOUND OF THE TESTICLES TECHNIQUE: Complete ultrasound examination of the testicles, epididymis, and other scrotal structures was performed. Color and spectral Doppler ultrasound were  also utilized to evaluate blood flow to the testicles. COMPARISON:  None. FINDINGS: Right testicle Measurements: 5.0 x 2.9 x 3.0 cm. No mass or microlithiasis visualized. Left testicle Measurements: 5.0 x 2.9 x 3.0 cm. No mass or microlithiasis visualized. Right epididymis:  Normal in size and appearance. Left epididymis:  Normal in size and appearance. Hydrocele:  Minimal right hydrocele Varicocele:  None visualized. Pulsed Doppler interrogation of both testes demonstrates normal low resistance arterial and venous waveforms bilaterally. IMPRESSION: Unremarkable testicular ultrasound. Electronically Signed   By: Elgie Collard M.D.   On: 04/30/2016 06:27  [place] Art/ven Flow Abd Pelv Doppler  Result Date: 04/30/2016 CLINICAL DATA:  30 year old male with sudden  onset abdominal pain radiating to the testicles. EXAM: SCROTAL ULTRASOUND DOPPLER ULTRASOUND OF THE TESTICLES TECHNIQUE: Complete ultrasound examination of the testicles, epididymis, and other scrotal structures was performed. Color and spectral Doppler ultrasound were also utilized to evaluate blood flow to the testicles. COMPARISON:  None. FINDINGS: Right testicle Measurements: 5.0 x 2.9 x 3.0 cm. No mass or microlithiasis visualized. Left testicle Measurements: 5.0 x 2.9 x 3.0 cm. No mass or microlithiasis visualized. Right epididymis:  Normal in size and appearance. Left epididymis:  Normal in size and appearance. Hydrocele:  Minimal right hydrocele Varicocele:  None visualized. Pulsed Doppler interrogation of both testes demonstrates normal low resistance arterial and venous waveforms bilaterally. IMPRESSION: Unremarkable testicular ultrasound. Electronically Signed   By: Elgie Collard M.D.   On: 04/30/2016 06:27     Dione Booze, MD 04/30/16 [phone]

## 2016-04-30 NOTE — ED Notes (Signed)
Patient was educated not to drive, operate heavy machinery, or drink alcohol while taking narcotic medication.  

## 2016-04-30 NOTE — ED Triage Notes (Signed)
Pt reports awoke from sleep with severe low mid abdominal  That caused him to become syncopal and had sudden urge to have BM. Pt reports having normal BM and admits to nausea no emesis.

## 2016-04-30 NOTE — ED Notes (Signed)
Patient aware that MD ordered and recommended CT renal stone study. Pt states he recently had such a CT and does not want it at this time.

## 2016-04-30 NOTE — Discharge Instructions (Signed)
See the Urologist even if everything returns normal. If the pain recurs - go straight to [facility] in [place].

## 2016-06-17 ENCOUNTER — Ambulatory Visit: Payer: 59 | Admitting: Family Medicine

## 2016-06-18 ENCOUNTER — Encounter: Payer: Self-pay | Admitting: Family Medicine

## 2016-06-18 ENCOUNTER — Ambulatory Visit (INDEPENDENT_AMBULATORY_CARE_PROVIDER_SITE_OTHER): Payer: 59 | Admitting: Family Medicine

## 2016-06-18 DIAGNOSIS — M25561 Pain in right knee: Secondary | ICD-10-CM

## 2016-06-18 MED ORDER — DICLOFENAC SODIUM 75 MG PO TBEC: 75.0000 mg | DELAYED_RELEASE_TABLET | Freq: Two times a day (BID) | ORAL | 1 refills | Status: DC

## 2016-06-18 NOTE — Patient Instructions (Signed)
Your primary issue is pes bursitis and overuse of these muscles - you are also overusing the IT band and hamstrings as a result. Icing 15 minutes at a time 3-4 times a day. Diclofenac 75mg  twice a day with food for 7-10 days then as needed. Modify your activities as we discussed. Hamstring curls, swings, hacky sack exercises, side leg raises 3 sets of 10 once a day. Add ankle weights if these become too easy. Consider physical therapy. Wear knee brace or sleeve when up and walking around for support. Follow up with me in 1 month but of course you can call me sooner.

## 2016-06-22 DIAGNOSIS — M25561 Pain in right knee: Secondary | ICD-10-CM | POA: Insufficient documentation

## 2016-06-22 NOTE — Assessment & Plan Note (Signed)
exam is reassuring.  Mechanism and this point to pes bursitis and overuse of pes muscles as cause of his pain.  Icing, start diclofenac for 7-10 days then as needed.  Shown home exercises to do daily.  Knee brace/sleeve for support.  F/u in 1 month.

## 2016-06-22 NOTE — Progress Notes (Signed)
PCP: No PCP Per Patient  Subjective:   HPI: Patient is a 30 y.o. male here for right knee pain.  Patient denies known injury He reports for past 2-3 weeks he's had 2/10 level medial right knee pain. Up to 4/10 at worst, associated tightness, described as soreness. Better with heat, rest. 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 No skin changes, numbness. No giving out, catching, locking. Not taking any medications for this.  Past Medical History:  Diagnosis Date  . Hereditary spherocytosis (HCC)   . Hypertension     Current Outpatient Prescriptions on File Prior to Visit  Medication Sig Dispense Refill  . B Complex-C (B-COMPLEX WITH VITAMIN C) tablet Take 1 tablet by mouth daily.    . folic acid (FOLVITE) 1 MG tablet Take 5 mg by mouth daily.    . metoCLOPramide (REGLAN) 10 MG tablet Take 1 tablet (10 mg total) by mouth every 6 (six) hours as needed for nausea. 30 tablet 0  . metoprolol succinate (TOPROL-XL) 25 MG 24 hr tablet Take 25 mg by mouth daily.    . simethicone (MYLICON) 80 MG chewable tablet Chew 80 mg by mouth once.    Marland Kitchen. telmisartan (MICARDIS) 40 MG tablet Take 40 mg by mouth daily.     No current facility-administered medications on file prior to visit.     Past Surgical History:  Procedure Laterality Date  . APPENDECTOMY      Allergies  Allergen Reactions  . Sulfa Antibiotics     unknown    Social History   Social History  . Marital status: Married    Spouse name: N/A  . Number of children: N/A  . Years of education: N/A   Occupational History  . Not on file.   Social History Main Topics  . Smoking status: Never Smoker  . Smokeless tobacco: Never Used  . Alcohol use No  . Drug use: No  . Sexual activity: Not on file   Other Topics Concern  . Not on file   Social History Narrative  . No narrative on file    No family  history on file.  BP 124/82   Pulse (!) 101   Ht 5\' 6"  (1.676 m)   Wt 175 lb (79.4 kg)   BMI 28.25 kg/m   Review of Systems: See HPI above.    Objective:  Physical Exam:  Gen: NAD, comfortable in exam room  Right knee: No gross deformity, ecchymoses, swelling. TTP over pes bursa, less proximal to this.  No joint line, other tenderness. FROM without pain on resisted hamstring, sartorius testing. Negative ant/post drawers. Negative valgus/varus testing. Negative lachmanns. Negative mcmurrays, apleys, patellar apprehension. NV intact distally.  Left knee: FROM without pain.    Assessment & Plan:  1. Right knee pain - exam is reassuring.  Mechanism and this point to pes bursitis and overuse of pes muscles as cause of his pain.  Icing, start diclofenac for 7-10 days then as needed.  Shown home exercises to do daily.  Knee brace/sleeve for support.  F/u in 1 month.

## 2016-12-28 DIAGNOSIS — D649 Anemia, unspecified: Secondary | ICD-10-CM | POA: Insufficient documentation

## 2017-01-21 ENCOUNTER — Telehealth: Payer: Self-pay | Admitting: *Deleted

## 2017-01-21 NOTE — Telephone Encounter (Signed)
Patient wants to be seen for a self referral. Patient has hereditary spherocytosis. It's been quite some time since he's been under the care of any hematologist. He would like to establish care here.  Spoke to Dr Myna Hidalgo. He would like to see patient in 4 weeks. Message sent to scheduler.

## 2017-02-11 ENCOUNTER — Other Ambulatory Visit (HOSPITAL_BASED_OUTPATIENT_CLINIC_OR_DEPARTMENT_OTHER): Payer: 59

## 2017-02-11 ENCOUNTER — Ambulatory Visit (HOSPITAL_BASED_OUTPATIENT_CLINIC_OR_DEPARTMENT_OTHER): Payer: 59 | Admitting: Family

## 2017-02-11 ENCOUNTER — Ambulatory Visit: Payer: 59

## 2017-02-11 VITALS — BP 137/85 | HR 101 | Temp 98.7°F | Resp 17 | Wt 170.4 lb

## 2017-02-11 DIAGNOSIS — R161 Splenomegaly, not elsewhere classified: Secondary | ICD-10-CM

## 2017-02-11 DIAGNOSIS — D649 Anemia, unspecified: Secondary | ICD-10-CM | POA: Diagnosis not present

## 2017-02-11 DIAGNOSIS — D580 Hereditary spherocytosis: Secondary | ICD-10-CM

## 2017-02-11 LAB — CHCC SATELLITE - SMEAR

## 2017-02-11 LAB — CBC WITH DIFFERENTIAL (CANCER CENTER ONLY)
BASO#: 0.1 10*3/uL (ref 0.0–0.2)
BASO%: 0.6 % (ref 0.0–2.0)
EOS ABS: 0.1 10*3/uL (ref 0.0–0.5)
EOS%: 0.7 % (ref 0.0–7.0)
HCT: 33.9 % — ABNORMAL LOW (ref 38.7–49.9)
HEMOGLOBIN: 11.8 g/dL — AB (ref 13.0–17.1)
LYMPH#: 2.4 10*3/uL (ref 0.9–3.3)
LYMPH%: 25.1 % (ref 14.0–48.0)
MCH: 28.6 pg (ref 28.0–33.4)
MCHC: 34.8 g/dL (ref 32.0–35.9)
MCV: 82 fL (ref 82–98)
MONO#: 0.5 10*3/uL (ref 0.1–0.9)
MONO%: 5.3 % (ref 0.0–13.0)
NEUT#: 6.5 10*3/uL (ref 1.5–6.5)
NEUT%: 68.3 % (ref 40.0–80.0)
PLATELETS: 164 10*3/uL (ref 145–400)
RBC: 4.12 10*6/uL — ABNORMAL LOW (ref 4.20–5.70)
RDW: 21.6 % — AB (ref 11.1–15.7)
WBC: 9.6 10*3/uL (ref 4.0–10.0)

## 2017-02-11 NOTE — Progress Notes (Signed)
Hematology/Oncology Consultation   Name: Deny Chevez      MRN: [id number]    Location: Room/bed info not found  Date: 02/11/2017 Time:10:49 PM   REFERRING PHYSICIAN: Self  REASON FOR CONSULT: Hereditary spherocytosis    DIAGNOSIS: Hereditary spherocytosis   HISTORY OF PRESENT ILLNESS: Mr. Hatfield is a very pleasant 31 yo gentleman originally from [place]. He states that he was diagnosed with hereditary spherocytosis when he was 31 years old after developing anemia. He states that his Hgb had dropped to 4 or 5 at that time. He was then put on Folic acid 5 mg PO daily and B complex.  He states that her parents were both tested for HS and were both negative.  He never had to be transfused and is still on the same folic acid and B complex regimen.  He now has a 79 month old son that was diagnosed with HS several days after birth. He is seeing a pediatric hematologist at [facility] and the patient would like to establish care with hematology as well.  He states that he was treated for both TB and [place] in [place] as a child and his doctors there questioned whether the strong antibiotics he received may have triggered the HS.  He also states that he had what sounds like a seizure after receiving a sulfa antibiotic.  He still has his spleen. In 2011 he had a gallbladder attack wich passed with fluids and antibiotics and he still has his gallbladder.  He states that he does have some occasional bloating and "heaviness" in his abdomen and takes lactobacillus as needed which helps.  CT of the abdomen in March of last year showed mild splenomegaly.  He has occasional fatigue which he attributes to staying busy with work and a new baby.  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  No jaundice noted on exam. He states that his total bilirubin is elevated at times.  No fever, chills, n/v, cough, rash, dizziness, SOB, chest pain, palpitations, abdominal pain or changes in  bowel or bladder habits.  No swelling, tenderness, numbness or tingling in her extremities. No c/o pain at this time.  He has maintained a good appetite and is staying well hydrated. His weight is stable.   ROS: All other 10 point review of systems is negative.   PAST MEDICAL HISTORY:   Past Medical History:  Diagnosis Date  . Hereditary spherocytosis (HCC)   . Hypertension     ALLERGIES: Allergies  Allergen Reactions  . Sulfa Antibiotics     unknown      MEDICATIONS:  Current Outpatient Prescriptions on File Prior to Visit  Medication Sig Dispense Refill  . B Complex-C (B-COMPLEX WITH VITAMIN C) tablet Take 1 tablet by mouth daily.    . folic acid (FOLVITE) 1 MG tablet Take 5 mg by mouth daily.    . metoprolol succinate (TOPROL-XL) 25 MG 24 hr tablet Take 25 mg by mouth daily.    Marland Kitchen telmisartan (MICARDIS) 40 MG tablet Take 40 mg by mouth daily.     No current facility-administered medications on file prior to visit.      PAST SURGICAL HISTORY Past Surgical History:  Procedure Laterality Date  . APPENDECTOMY      FAMILY HISTORY: No family history on file.  SOCIAL HISTORY:  reports that he has never smoked. He has never used smokeless tobacco. He reports that he does not drink alcohol or use  drugs.  PERFORMANCE STATUS: The patient's performance status is 1 - Symptomatic but completely ambulatory  PHYSICAL EXAM: Most Recent Vital Signs: Blood pressure 137/85, pulse (!) 101, temperature 98.7 F (37.1 C), temperature source Oral, resp. rate 17, weight 170 lb 6.4 oz (77.3 kg), SpO2 100 %. BP 137/85 (BP Location: Right Arm, Patient Position: Sitting)   Pulse (!) 101   Temp 98.7 F (37.1 C) (Oral)   Resp 17   Wt 170 lb 6.4 oz (77.3 kg)   SpO2 100%   BMI 27.50 kg/m   General Appearance:    Alert, cooperative, no distress, appears stated age  Head:    Normocephalic, without obvious abnormality, atraumatic  Eyes:    PERRL, conjunctiva/corneas clear, EOM's intact,  fundi    benign, both eyes             Throat:   Lips, mucosa, and tongue normal; teeth and gums normal  Neck:   Supple, symmetrical, trachea midline, no adenopathy;       thyroid:  No enlargement/tenderness/nodules; no carotid   bruit or JVD  Back:     Symmetric, no curvature, ROM normal, no CVA tenderness  Lungs:     Clear to auscultation bilaterally, respirations unlabored  Chest wall:    No tenderness or deformity  Heart:    Regular rate and rhythm, S1 and S2 normal, no murmur, rub   or gallop  Abdomen:     Soft, non-tender, bowel sounds active all four quadrants,    no masses, no organomegaly        Extremities:   Extremities normal, atraumatic, no cyanosis or edema  Pulses:   2+ and symmetric all extremities  Skin:   Skin color, texture, turgor normal, no rashes or lesions  Lymph nodes:   Cervical, supraclavicular, and axillary nodes normal  Neurologic:   CNII-XII intact. Normal strength, sensation and reflexes      throughout    LABORATORY DATA:  Results for orders placed or performed in visit on 02/11/17 (from the past 48 hour(s))  CBC with Differential [facility])     Status: Abnormal   Collection Time: 02/11/17  3:29 PM  Result Value Ref Range   WBC 9.6 4.0 - 10.0 10e3/uL   RBC 4.12 (L) 4.20 - 5.70 10e6/uL   HGB 11.8 (L) 13.0 - 17.1 g/dL   HCT 36.6 (L) 44.0 - 34.7 %   MCV 82 82 - 98 fL   MCH 28.6 28.0 - 33.4 pg   MCHC 34.8 32.0 - 35.9 g/dL   RDW 42.5 (H) 95.6 - 38.7 %   Platelets 164 145 - 400 10e3/uL   NEUT# 6.5 1.5 - 6.5 10e3/uL   LYMPH# 2.4 0.9 - 3.3 10e3/uL   MONO# 0.5 0.1 - 0.9 10e3/uL   Eosinophils Absolute 0.1 0.0 - 0.5 10e3/uL   BASO# 0.1 0.0 - 0.2 10e3/uL   NEUT% 68.3 40.0 - 80.0 %   LYMPH% 25.1 14.0 - 48.0 %   MONO% 5.3 0.0 - 13.0 %   EOS% 0.7 0.0 - 7.0 %   BASO% 0.6 0.0 - 2.0 %  [facility] - Smear     Status: None   Collection Time: 02/11/17  3:29 PM  Result Value Ref Range   Smear Result Smear Available       RADIOGRAPHY: No  results found.     PATHOLOGY: None   ASSESSMENT/PLAN: Mr. Litke is a very pleasant 31 yo [place] gentleman with history of hereditary spherocytosis diagnosed when  he was 31 years old. His 107 month old son was diagnosed with HS several days after birth.  He is taking his folic acid 5 mg and B complex daily and appears to be doing quite well. He has not had a gallbladder attack in 7 years and still has his spleen in. Hgb is stable at 11.8 with an MCV of 82.  We will repeat an abdominal US to re-evaluate his mild splenomegaly and gallbladder next week.  We will plan to see him back again in 6 months for repeat lab work and follow-up.   All questions were answered. He will contact our office with any questions or concerns. We can certainly see him much sooner if necessary.  He was discussed with and also seen by Dr. Myna HidalgoEnnever and he is in agreement with the aforementioned.   [facility],Janika Jedlicka M     Addendum:  I saw and examined the patient with Valeria Krisko. I agree with the above assessment.  I'm very impressed that he is on the folic acid. I must say this is quite a high dose of folic acid but I would not change this.  I do worry about him with cholecystitis. I am sure that he has cholelithiasis.  I think if he has another episode of cholecystitis or if he develops pancreatitis from gallstone disease, then he will need to have his gallbladder taken out.  The decision to do a splenectomy might be a little bit more complicated. He does not have bad anemia. He has had no problems with respect to hemolysis. I'm not sure what his reticulocyte count is.  We will plan to follow him along. We will get an ultrasound to assess for splenomegaly.  We will see what his osmotic fragility studies show.  We spent about 40 minutes with him. We answered his questions. He obviously is very well versed in spherocytosis. He certainly knows what to look for.  Christin BachPete Ennever, MD

## 2017-02-12 LAB — IRON AND TIBC
%SAT: 42 % (ref 20–55)
IRON: 115 ug/dL (ref 42–163)
TIBC: 273 ug/dL (ref 202–409)
UIBC: 158 ug/dL (ref 117–376)

## 2017-02-12 LAB — FERRITIN: FERRITIN: 196 ng/mL (ref 22–316)

## 2017-02-12 LAB — LACTATE DEHYDROGENASE: LDH: 234 U/L (ref 125–245)

## 2017-02-13 LAB — RETICULOCYTES: RETICULOCYTE COUNT: 10.2 % — AB (ref 0.6–2.6)

## 2017-02-15 ENCOUNTER — Ambulatory Visit (HOSPITAL_BASED_OUTPATIENT_CLINIC_OR_DEPARTMENT_OTHER): Admission: RE | Admit: 2017-02-15 | Payer: 59 | Source: Ambulatory Visit

## 2017-02-17 LAB — RBC OSMOTIC FRAGILITY

## 2017-02-22 ENCOUNTER — Other Ambulatory Visit: Payer: Self-pay | Admitting: *Deleted

## 2017-02-22 ENCOUNTER — Other Ambulatory Visit (HOSPITAL_BASED_OUTPATIENT_CLINIC_OR_DEPARTMENT_OTHER): Payer: 59

## 2017-02-22 ENCOUNTER — Other Ambulatory Visit: Payer: Self-pay | Admitting: Family

## 2017-02-22 DIAGNOSIS — R161 Splenomegaly, not elsewhere classified: Secondary | ICD-10-CM

## 2017-02-22 DIAGNOSIS — D580 Hereditary spherocytosis: Secondary | ICD-10-CM | POA: Diagnosis not present

## 2017-02-22 LAB — COMPREHENSIVE METABOLIC PANEL
ALK PHOS: 84 U/L (ref 40–150)
ALT: 34 U/L (ref 0–55)
ANION GAP: 8 meq/L (ref 3–11)
AST: 27 U/L (ref 5–34)
Albumin: 4.7 g/dL (ref 3.5–5.0)
BUN: 11.2 mg/dL (ref 7.0–26.0)
CO2: 25 mEq/L (ref 22–29)
Calcium: 9.4 mg/dL (ref 8.4–10.4)
Chloride: 106 mEq/L (ref 98–109)
Creatinine: 0.9 mg/dL (ref 0.7–1.3)
GLUCOSE: 135 mg/dL (ref 70–140)
Potassium: 3.7 mEq/L (ref 3.5–5.1)
Sodium: 140 mEq/L (ref 136–145)
Total Bilirubin: 2.64 mg/dL — ABNORMAL HIGH (ref 0.20–1.20)
Total Protein: 7.2 g/dL (ref 6.4–8.3)

## 2017-02-27 LAB — RBC OSMOTIC FRAGILITY

## 2017-05-12 ENCOUNTER — Encounter: Payer: Self-pay | Admitting: Family

## 2017-05-13 ENCOUNTER — Other Ambulatory Visit: Payer: Self-pay | Admitting: Family

## 2017-05-13 DIAGNOSIS — D580 Hereditary spherocytosis: Secondary | ICD-10-CM

## 2017-05-19 ENCOUNTER — Other Ambulatory Visit: Payer: Self-pay | Admitting: *Deleted

## 2017-05-19 ENCOUNTER — Other Ambulatory Visit (HOSPITAL_BASED_OUTPATIENT_CLINIC_OR_DEPARTMENT_OTHER): Payer: 59

## 2017-05-19 ENCOUNTER — Ambulatory Visit (HOSPITAL_BASED_OUTPATIENT_CLINIC_OR_DEPARTMENT_OTHER): Payer: Self-pay

## 2017-05-19 DIAGNOSIS — D580 Hereditary spherocytosis: Secondary | ICD-10-CM | POA: Diagnosis not present

## 2017-05-21 ENCOUNTER — Other Ambulatory Visit: Payer: Self-pay

## 2017-05-21 ENCOUNTER — Other Ambulatory Visit (HOSPITAL_BASED_OUTPATIENT_CLINIC_OR_DEPARTMENT_OTHER): Payer: Self-pay

## 2017-05-21 ENCOUNTER — Ambulatory Visit (HOSPITAL_BASED_OUTPATIENT_CLINIC_OR_DEPARTMENT_OTHER)
Admission: RE | Admit: 2017-05-21 | Discharge: 2017-05-21 | Disposition: A | Discharge status: Home / Self Care | Payer: 59 | Source: Ambulatory Visit | Attending: Family | Admitting: Family

## 2017-05-21 ENCOUNTER — Telehealth: Payer: Self-pay | Admitting: Family

## 2017-05-21 DIAGNOSIS — D580 Hereditary spherocytosis: Secondary | ICD-10-CM | POA: Diagnosis present

## 2017-05-21 DIAGNOSIS — R161 Splenomegaly, not elsewhere classified: Secondary | ICD-10-CM | POA: Diagnosis not present

## 2017-05-21 DIAGNOSIS — N289 Disorder of kidney and ureter, unspecified: Secondary | ICD-10-CM | POA: Diagnosis not present

## 2017-05-21 DIAGNOSIS — K824 Cholesterolosis of gallbladder: Secondary | ICD-10-CM | POA: Insufficient documentation

## 2017-05-21 NOTE — Telephone Encounter (Signed)
I spoke with Clarence Hines and gave him the results of his abdominal US and plan to repeat in 9-12 months. He is in agreement with the plan and all questions were answered. We will call with his RBC osmotic fragility test when they are available.

## 2017-05-24 ENCOUNTER — Other Ambulatory Visit: Payer: Self-pay | Admitting: Family

## 2017-05-24 DIAGNOSIS — R161 Splenomegaly, not elsewhere classified: Secondary | ICD-10-CM

## 2017-05-24 DIAGNOSIS — D580 Hereditary spherocytosis: Secondary | ICD-10-CM

## 2017-05-24 DIAGNOSIS — N289 Disorder of kidney and ureter, unspecified: Secondary | ICD-10-CM

## 2017-05-26 LAB — RBC OSMOTIC FRAGILITY

## 2017-06-14 ENCOUNTER — Other Ambulatory Visit: Payer: Self-pay | Admitting: Family

## 2017-06-14 DIAGNOSIS — D580 Hereditary spherocytosis: Secondary | ICD-10-CM

## 2017-06-16 ENCOUNTER — Other Ambulatory Visit: Payer: Self-pay

## 2017-08-13 ENCOUNTER — Other Ambulatory Visit: Payer: Self-pay | Admitting: Family

## 2017-08-13 DIAGNOSIS — D580 Hereditary spherocytosis: Secondary | ICD-10-CM

## 2017-08-16 ENCOUNTER — Other Ambulatory Visit: Payer: 59

## 2017-08-16 ENCOUNTER — Ambulatory Visit: Payer: 59 | Admitting: Family

## 2018-02-16 ENCOUNTER — Inpatient Hospital Stay: Payer: PRIVATE HEALTH INSURANCE | Attending: Hematology & Oncology | Admitting: Hematology & Oncology

## 2018-02-16 ENCOUNTER — Other Ambulatory Visit: Payer: Self-pay

## 2018-02-16 ENCOUNTER — Encounter: Payer: Self-pay | Admitting: Hematology & Oncology

## 2018-02-16 ENCOUNTER — Inpatient Hospital Stay: Payer: PRIVATE HEALTH INSURANCE | Attending: Hematology & Oncology

## 2018-02-16 VITALS — BP 139/85 | HR 91 | Temp 98.7°F | Resp 20 | Wt 175.4 lb

## 2018-02-16 DIAGNOSIS — D580 Hereditary spherocytosis: Secondary | ICD-10-CM | POA: Insufficient documentation

## 2018-02-16 DIAGNOSIS — R1011 Right upper quadrant pain: Secondary | ICD-10-CM | POA: Insufficient documentation

## 2018-02-16 LAB — CBC WITH DIFFERENTIAL (CANCER CENTER ONLY)
BASOS ABS: 0 10*3/uL (ref 0.0–0.1)
Basophils Relative: 0 %
EOS ABS: 0.2 10*3/uL (ref 0.0–0.5)
EOS PCT: 2 %
HCT: 34.6 % — ABNORMAL LOW (ref 38.7–49.9)
Hemoglobin: 11.8 g/dL — ABNORMAL LOW (ref 13.0–17.1)
LYMPHS PCT: 28 %
Lymphs Abs: 2.9 10*3/uL (ref 0.9–3.3)
MCH: 28.5 pg (ref 28.0–33.4)
MCHC: 34.1 g/dL (ref 32.0–35.9)
MCV: 83.6 fL (ref 82.0–98.0)
MONO ABS: 0.7 10*3/uL (ref 0.1–0.9)
Monocytes Relative: 7 %
Neutro Abs: 6.5 10*3/uL (ref 1.5–6.5)
Neutrophils Relative %: 63 %
Platelet Count: 166 10*3/uL (ref 145–400)
RBC: 4.14 MIL/uL — AB (ref 4.20–5.70)
RDW: 22.6 % — AB (ref 11.1–15.7)
WBC: 10.2 10*3/uL — AB (ref 4.0–10.0)

## 2018-02-16 LAB — CMP (CANCER CENTER ONLY)
ALBUMIN: 4.4 g/dL (ref 3.5–5.0)
ALT: 37 U/L (ref 10–47)
AST: 27 U/L (ref 11–38)
Alkaline Phosphatase: 80 U/L (ref 26–84)
Anion gap: 5 (ref 5–15)
BILIRUBIN TOTAL: 2.4 mg/dL — AB (ref 0.2–1.6)
BUN: 9 mg/dL (ref 7–22)
CALCIUM: 9.7 mg/dL (ref 8.0–10.3)
CO2: 28 mmol/L (ref 18–33)
Chloride: 110 mmol/L — ABNORMAL HIGH (ref 98–108)
Creatinine: 1 mg/dL (ref 0.60–1.20)
GLUCOSE: 103 mg/dL (ref 73–118)
Potassium: 3.7 mmol/L (ref 3.3–4.7)
Sodium: 143 mmol/L (ref 128–145)
Total Protein: 7.3 g/dL (ref 6.4–8.1)

## 2018-02-16 LAB — BILIRUBIN, DIRECT: Bilirubin, Direct: 0.3 mg/dL (ref 0.1–0.5)

## 2018-02-16 LAB — TECHNOLOGIST SMEAR REVIEW

## 2018-02-16 NOTE — Progress Notes (Signed)
Hematology and Oncology Follow Up Visit  Clarence Hines [id number] Apr 01, 1986 32 y.o. 02/16/2018   Principle Diagnosis:   Hereditary spherocytosis   Current Therapy:    Folic acid 2 mg p.o. daily     Interim History:  Clarence Hines is back for follow-up.  We saw him initially a year ago.  He cannot make his follow-up appointments.  He has been very busy.  He has a baby now.  Unfortunately, we kept trying to send off the osmotic fragility studies and the lab kept messing them up.  He is complaining of pain in the right upper quadrant of his abdomen.  This happens when he eats.  He feels quite full.  When we initially saw him, he did have an ultrasound done.  This was done in August 2018.  This showed chronic splenomegaly with a volume of 830 cm.  He has had some nausea but no vomiting.  He has had no fever.  Is had no bleeding.  He has had no rashes.  When we initially saw him, we had iron studies done.  His ferritin was 196 with an iron saturation of 42%.  He has had no weight loss or weight gain.  He is trying to exercise.  He is still working.  He wants to go back to [place] with his family sometime this summer.  He is a little worried about being jaundiced.  His labs today shows bilirubin going to be 2.4.  Overall, his performance status is ECOG 0.  Medications:  Current Outpatient Medications:  .  B Complex-C (B-COMPLEX WITH VITAMIN C) tablet, Take 1 tablet by mouth daily., Disp: , Rfl:  .  folic acid (FOLVITE) 1 MG tablet, Take 5 mg by mouth daily., Disp: , Rfl:  .  metoprolol succinate (TOPROL-XL) 25 MG 24 hr tablet, Take 25 mg by mouth daily., Disp: , Rfl:  .  telmisartan (MICARDIS) 40 MG tablet, Take 40 mg by mouth daily., Disp: , Rfl:   Allergies:  Allergies  Allergen Reactions  . Sulfa Antibiotics     unknown    Past Medical History, Surgical history, Social history, and Family History were reviewed and updated.  Review of Systems: Review of Systems    Constitutional: Negative.   HENT:  Negative.   Eyes: Negative.   Respiratory: Negative.   Cardiovascular: Negative.   Gastrointestinal: Positive for abdominal pain, diarrhea and nausea.  Endocrine: Negative.   Genitourinary: Positive for frequency and nocturia.   Musculoskeletal: Negative.   Skin: Negative.   Neurological: Negative.   Hematological: Negative.   Psychiatric/Behavioral: Negative.     Physical Exam:  weight is 175 lb 6.4 oz (79.6 kg). His oral temperature is 98.7 F (37.1 C). His blood pressure is 139/85 and his pulse is 91. His respiration is 20 and oxygen saturation is 100%.   Wt Readings from Last 3 Encounters:  02/16/18 175 lb 6.4 oz (79.6 kg)  02/11/17 170 lb 6.4 oz (77.3 kg)  06/18/16 175 lb (79.4 kg)    Physical Exam  Constitutional: He is oriented to person, place, and time.  HENT:  Head: Normocephalic and atraumatic.  Mouth/Throat: Oropharynx is clear and moist.  Eyes: Pupils are equal, round, and reactive to light. EOM are normal.  Neck: Normal range of motion.  Cardiovascular: Normal rate, regular rhythm and normal heart sounds.  Pulmonary/Chest: Effort normal and breath sounds normal.  Abdominal: Soft. Bowel sounds are normal.  Musculoskeletal: Normal range of motion. He exhibits no edema, tenderness or  deformity.  Lymphadenopathy:    He has no cervical adenopathy.  Neurological: He is alert and oriented to person, place, and time.  Skin: Skin is warm and dry. No rash noted. No erythema.  Psychiatric: He has a normal mood and affect. His behavior is normal. Judgment and thought content normal.  Vitals reviewed.    Lab Results  Component Value Date   WBC 10.2 (H) 02/16/2018   HGB 11.8 (L) 02/16/2018   HCT 34.6 (L) 02/16/2018   MCV 83.6 02/16/2018   PLT 166 02/16/2018     Chemistry      Component Value Date/Time   NA 143 02/16/2018 1506   NA 140 02/22/2017 0917   K 3.7 02/16/2018 1506   K 3.7 02/22/2017 0917   CL 110 (H) 02/16/2018  1506   CO2 28 02/16/2018 1506   CO2 25 02/22/2017 0917   BUN 9 02/16/2018 1506   BUN 11.2 02/22/2017 0917   CREATININE 1.00 02/16/2018 1506   CREATININE 0.9 02/22/2017 0917      Component Value Date/Time   CALCIUM 9.7 02/16/2018 1506   CALCIUM 9.4 02/22/2017 0917   ALKPHOS 80 02/16/2018 1506   ALKPHOS 84 02/22/2017 0917   AST 27 02/16/2018 1506   AST 27 02/22/2017 0917   ALT 37 02/16/2018 1506   ALT 34 02/22/2017 0917   BILITOT 2.4 (H) 02/16/2018 1506   BILITOT 2.64 (H) 02/22/2017 0917         Impression and Plan: Clarence Hines is a 32 year old [place] Hines.  He has hereditary spherocytosis.  It is hard to say what might be going on.  However, the gallbladder is definitely a concern.  I think that we have to get an ultrasound of his abdomen.  I think he also is going to need a gallbladder emptying scan to see if there is some type of gallbladder dysfunction.  It is not surprising that he would need to have his gallbladder out if there is a problem.  His labs look pretty stable.  I do not see that the hereditary spherocytosis is any worse.  His spleen might be a little bit larger on physical exam.  I spent about 30 minutes with him today.  Over half time was spent face-to-face with him.  I went over his labs.  I explained my recommendations.  He agrees.  We will get him back in 1 month.   Josph Macho, MD 5/15/20194:41 PM

## 2018-02-17 LAB — RETICULOCYTES
RBC.: 4.14 MIL/uL — AB (ref 4.22–5.81)
RETIC CT PCT: 11.5 % — AB (ref 0.4–3.1)
Retic Count, Absolute: 476.1 10*3/uL — ABNORMAL HIGH (ref 19.0–186.0)

## 2018-02-19 ENCOUNTER — Ambulatory Visit (HOSPITAL_BASED_OUTPATIENT_CLINIC_OR_DEPARTMENT_OTHER)
Admission: RE | Admit: 2018-02-19 | Discharge: 2018-02-19 | Disposition: A | Discharge status: Home / Self Care | Payer: PRIVATE HEALTH INSURANCE | Source: Ambulatory Visit | Attending: Hematology & Oncology | Admitting: Hematology & Oncology

## 2018-02-19 DIAGNOSIS — K760 Fatty (change of) liver, not elsewhere classified: Secondary | ICD-10-CM | POA: Insufficient documentation

## 2018-02-19 DIAGNOSIS — D580 Hereditary spherocytosis: Secondary | ICD-10-CM | POA: Diagnosis present

## 2018-02-19 DIAGNOSIS — R161 Splenomegaly, not elsewhere classified: Secondary | ICD-10-CM | POA: Insufficient documentation

## 2018-02-19 DIAGNOSIS — N2889 Other specified disorders of kidney and ureter: Secondary | ICD-10-CM | POA: Insufficient documentation

## 2018-02-20 ENCOUNTER — Emergency Department (HOSPITAL_BASED_OUTPATIENT_CLINIC_OR_DEPARTMENT_OTHER)
Admission: EM | Admit: 2018-02-20 | Discharge: 2018-02-20 | Disposition: A | Discharge status: Home / Self Care | Payer: PRIVATE HEALTH INSURANCE | Attending: Emergency Medicine | Admitting: Emergency Medicine

## 2018-02-20 ENCOUNTER — Encounter (HOSPITAL_BASED_OUTPATIENT_CLINIC_OR_DEPARTMENT_OTHER): Payer: Self-pay | Admitting: Emergency Medicine

## 2018-02-20 ENCOUNTER — Other Ambulatory Visit: Payer: Self-pay

## 2018-02-20 DIAGNOSIS — R110 Nausea: Secondary | ICD-10-CM | POA: Insufficient documentation

## 2018-02-20 DIAGNOSIS — R1011 Right upper quadrant pain: Secondary | ICD-10-CM

## 2018-02-20 DIAGNOSIS — Z79899 Other long term (current) drug therapy: Secondary | ICD-10-CM | POA: Diagnosis not present

## 2018-02-20 DIAGNOSIS — I10 Essential (primary) hypertension: Secondary | ICD-10-CM | POA: Diagnosis not present

## 2018-02-20 LAB — CBC WITH DIFFERENTIAL/PLATELET
Basophils Absolute: 0 10*3/uL (ref 0.0–0.1)
Basophils Relative: 0 %
EOS PCT: 1 %
Eosinophils Absolute: 0.1 10*3/uL (ref 0.0–0.7)
HCT: 33.5 % — ABNORMAL LOW (ref 39.0–52.0)
HEMOGLOBIN: 11.7 g/dL — AB (ref 13.0–17.0)
Lymphocytes Relative: 28 %
Lymphs Abs: 2.8 10*3/uL (ref 0.7–4.0)
MCH: 28.7 pg (ref 26.0–34.0)
MCHC: 34.9 g/dL (ref 30.0–36.0)
MCV: 82.3 fL (ref 78.0–100.0)
Monocytes Absolute: 0.6 10*3/uL (ref 0.1–1.0)
Monocytes Relative: 6 %
NEUTROS PCT: 65 %
Neutro Abs: 6.4 10*3/uL (ref 1.7–7.7)
Platelets: 164 10*3/uL (ref 150–400)
RBC: 4.07 MIL/uL — AB (ref 4.22–5.81)
RDW: 22.7 % — ABNORMAL HIGH (ref 11.5–15.5)
WBC: 9.9 10*3/uL (ref 4.0–10.5)

## 2018-02-20 LAB — COMPREHENSIVE METABOLIC PANEL
ALBUMIN: 4.8 g/dL (ref 3.5–5.0)
ALT: 23 U/L (ref 17–63)
ANION GAP: 7 (ref 5–15)
AST: 24 U/L (ref 15–41)
Alkaline Phosphatase: 64 U/L (ref 38–126)
BUN: 8 mg/dL (ref 6–20)
CO2: 24 mmol/L (ref 22–32)
Calcium: 9.3 mg/dL (ref 8.9–10.3)
Chloride: 109 mmol/L (ref 101–111)
Creatinine, Ser: 0.77 mg/dL (ref 0.61–1.24)
GFR calc non Af Amer: >60 mL/min (ref >60)
GLUCOSE: 115 mg/dL — AB (ref 65–99)
Potassium: 3.9 mmol/L (ref 3.5–5.1)
SODIUM: 140 mmol/L (ref 135–145)
Total Bilirubin: 1.8 mg/dL — ABNORMAL HIGH (ref 0.3–1.2)
Total Protein: 7.2 g/dL (ref 6.5–8.1)

## 2018-02-20 LAB — URINALYSIS, ROUTINE W REFLEX MICROSCOPIC
Bilirubin Urine: NEGATIVE
GLUCOSE, UA: NEGATIVE mg/dL
Hgb urine dipstick: NEGATIVE
Ketones, ur: NEGATIVE mg/dL
LEUKOCYTES UA: NEGATIVE
Nitrite: NEGATIVE
PH: 5.5 (ref 5.0–8.0)
PROTEIN: NEGATIVE mg/dL

## 2018-02-20 LAB — LIPASE, BLOOD: Lipase: 38 U/L (ref 11–51)

## 2018-02-20 MED ORDER — GI COCKTAIL ~~LOC~~
30.0000 mL | Freq: Once | ORAL | Status: AC
  Administered 2018-02-20: 30 mL via ORAL
  Filled 2018-02-20: qty 30

## 2018-02-20 MED ORDER — SUCRALFATE 1 G PO TABS: 1.0000 g | ORAL_TABLET | Freq: Three times a day (TID) | ORAL | 0 refills | Status: DC

## 2018-02-20 MED ORDER — HYOSCYAMINE SULFATE SL 0.125 MG SL SUBL: 0.1250 mg | SUBLINGUAL_TABLET | Freq: Three times a day (TID) | SUBLINGUAL | 0 refills | Status: DC | PRN

## 2018-02-20 NOTE — ED Provider Notes (Signed)
[facility] EMERGENCY DEPARTMENT Provider Note   CSN: [id number] Arrival date & time: 02/20/18  1611     History   Chief Complaint No chief complaint on file.   HPI Clarence Hines is a 32 y.o. male.  HPI 32 year old male past medical history significant for hypertension and hereditary spherocytosis and prior appendectomy presents to the emergency department today for evaluation of 2 weeks of right upper quadrant abdominal pain.  Patient describes the pain as a burning sensation.  Does not radiate.  Worse with food.  Patient states the pain persisted this morning which is why he came to the ED for evaluation.  Patient reports some nausea but denies any emesis.  Currently taking a PPI.  Patient was seen by his hematologist this week and mentioned the pain to him.  Patient had lab work at that time.  Patient has known elevated bilirubin.  Otherwise lab work was reassuring.  Patient did have an outpatient ultrasound yesterday that showed no acute findings were chronic findings were noted.  This was discussed with patient.  Patient denies any urinary symptoms.  Reports normal bowel movements without any melena or hematochezia.  Patient states that he does have a HIDA scan scheduled for this week.  Patient not taking for his pain prior to arrival.  Nothing makes it better.  Denies any testicular pain or swelling.  Denies any chest pain or shortness of breath. Past Medical History:  Diagnosis Date  . Hereditary spherocytosis (HCC)   . Hypertension     Patient Active Problem List   Diagnosis Date Noted  . Right knee pain 06/22/2016  . Anal fissure 01/09/2016  . Neck injury 07/09/2015  . Sinus tachycardia 05/24/2015  . Awareness of heartbeats 05/23/2015  . BP (high blood pressure) 05/11/2014  . Hereditary spherocytosis (HCC) 05/11/2014    Past Surgical History:  Procedure Laterality Date  . APPENDECTOMY          Home Medications    Prior to Admission medications     Medication Sig Start Date End Date Taking? Authorizing Provider  B Complex-C (B-COMPLEX WITH VITAMIN C) tablet Take 1 tablet by mouth daily.    [provider]  folic acid (FOLVITE) 1 MG tablet Take 5 mg by mouth daily.    [provider]  Hyoscyamine Sulfate SL (LEVSIN/SL) 0.125 MG SUBL Place 0.125 mg under the tongue 3 (three) times daily as needed. 02/20/18   Rise Mu, PA-C  metoprolol succinate (TOPROL-XL) 25 MG 24 hr tablet Take 25 mg by mouth daily.    [provider]  sucralfate (CARAFATE) 1 g tablet Take 1 tablet (1 g total) by mouth 4 (four) times daily -  with meals and at bedtime. 02/20/18   Rise Mu, PA-C  telmisartan (MICARDIS) 40 MG tablet Take 40 mg by mouth daily.    [provider]    Family History History reviewed. No pertinent family history.  Social History Social History   Tobacco Use  . Smoking status: Never Smoker  . Smokeless tobacco: Never Used  Substance Use Topics  . Alcohol use: No    Alcohol/week: 0.0 oz  . Drug use: No     Allergies   Sulfa antibiotics   Review of Systems Review of Systems  All other systems reviewed and are negative.    Physical Exam Updated Vital Signs BP (!) 138/93 (BP Location: Right Arm)   Pulse 72   Temp 98.8 F (37.1 C) (Oral)   Resp  16   Ht  (1.676 m)   Wt 79.4 kg (175 lb)   SpO2 100%   BMI 28.25 kg/m   Physical Exam  Constitutional: He is oriented to person, place, and time. He appears well-developed and well-nourished.  Non-toxic appearance. No distress.  HENT:  Head: Normocephalic and atraumatic.  Mouth/Throat: Oropharynx is clear and moist.  Eyes: Pupils are equal, round, and reactive to light. Conjunctivae are normal. Right eye exhibits no discharge. Left eye exhibits no discharge.  Neck: Normal range of motion. Neck supple.  Cardiovascular: Normal rate, regular rhythm, normal heart sounds and intact distal pulses. Exam reveals no gallop  and no friction rub.  No murmur heard. Pulmonary/Chest: Effort normal and breath sounds normal. No respiratory distress. He exhibits no tenderness.  Abdominal: Soft. Bowel sounds are normal. He exhibits no distension. There is tenderness (minimal) in the right upper quadrant and epigastric area. There is no rigidity, no rebound, no guarding, no CVA tenderness, no tenderness at McBurney's point and negative Murphy's sign.  Musculoskeletal: Normal range of motion. He exhibits no tenderness.  Lymphadenopathy:    He has no cervical adenopathy.  Neurological: He is alert and oriented to person, place, and time.  Skin: Skin is warm and dry. Capillary refill takes less than 2 seconds. No rash noted.  Psychiatric: His behavior is normal. Judgment and thought content normal.  Nursing note and vitals reviewed.    ED Treatments / Results  Labs (all labs ordered are listed, but only abnormal results are displayed) Labs Reviewed  COMPREHENSIVE METABOLIC PANEL - Abnormal; Notable for the following components:      Result Value   Glucose, Bld 115 (*)    Total Bilirubin 1.8 (*)    All other components within normal limits  CBC WITH DIFFERENTIAL/PLATELET - Abnormal; Notable for the following components:   RBC 4.07 (*)    Hemoglobin 11.7 (*)    HCT 33.5 (*)    RDW 22.7 (*)    All other components within normal limits  URINALYSIS, ROUTINE W REFLEX MICROSCOPIC - Abnormal; Notable for the following components:   Specific Gravity, Urine <1.005 (*)    All other components within normal limits  LIPASE, BLOOD    EKG None  Radiology US Abdomen Complete  Result Date: 02/20/2018 CLINICAL DATA:  Patient with right upper quadrant abdominal pain. History of splenomegaly. EXAM: ABDOMEN ULTRASOUND COMPLETE COMPARISON:  Abdominal ultrasound 05/21/2017. FINDINGS: Gallbladder: No gallbladder wall thickening or pericholecystic fluid. Negative sonographic Murphy's sign. 3 mm polyp within the gallbladder lumen.  Common bile duct: Diameter: 3 mm Liver: Increase in echogenicity. No focal lesion identified. Portal vein is patent on color Doppler imaging with normal direction of blood flow towards the liver. IVC: No abnormality visualized. Pancreas: Visualized portion unremarkable. Spleen: 14.9 cm. Right Kidney: Length: 10.4 cm. Normal renal cortical thickness and echogenicity. There is a subcentimeter echogenic lesion within the superior pole which may represent a small angiomyolipoma. Left Kidney: Length: 11.1 cm. Echogenicity within normal limits. No mass or hydronephrosis visualized. Abdominal aorta: No aneurysm visualized. Other findings: None. IMPRESSION: Hepatic steatosis. Splenomegaly. 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 Consider follow-up ultrasound in 12 months to ensure stability. Electronically Signed   By: Annia Belt M.D.   On: 02/20/2018 15:10    Procedures Procedures (including critical care time)  Medications Ordered in ED Medications  gi cocktail (Maalox,Lidocaine,Donnatal) (30 mLs Oral Given 02/20/18 1724)     Initial Impression / Assessment and  Plan / ED Course  I have reviewed the triage vital signs and the nursing notes.  Pertinent labs & imaging results that were available during my care of the patient were reviewed by me and considered in my medical decision making (see chart for details).     Patient presents to the ED for evaluation of ongoing right upper quadrant burning sensation.  Patient states this is been intermittent for the past 2 weeks.  Was seen by his hematologist this week.  Where he had lab work that was done that was reassuring.  Also had an outpatient ultrasound yesterday that findings were discussed with patient.  These appear chronic in nature but no acute findings of cholecystitis.  Patient has HIDA scan scheduled for next week.  Discussed further work-up in the ED.  Patient would like to avoid any  further imaging at this time.  Will do repeat lab work to make sure that there is no acute findings.  Patient's vital signs are very reassuring in the emergency department today.  Patient is afebrile.  No tachycardia noted.  Patient has no signs of peritonitis.  Bowel sounds present in all 4 quadrants.  Mild right upper quadrant and epigastric tenderness to palpation.  Lab work very reassuring.  Normal liver enzymes.  Bilirubin has improved.  Normal kidney function.  No leukocytosis.  UA shows no signs of infection.    Pain managed in the ED with GI cocktail with complete resolution of her symptoms.  I suspect the patient's symptoms are likely secondary to biliary colic.  We will give Levsin for this.  Encourage patient to take Carafate and continue his PPI as symptoms may be related to gastritis versus ulcer.  I do not feel the patient needs repeat ultrasound as the ultrasound yesterday was reassuring.  No signs of gallbladder sludge , stones or biliary duct dilation.  Low suspicion for cholecystitis, pancreatitis, cholangitis, bowel obstruction at this time.  She would like to avoid imaging including CT scan.  We will follow-up as HIDA scan this week and back with his primary care doctor.  Will give follow-up with GI.  Pt is hemodynamically stable, in NAD, & able to ambulate in the ED. Evaluation does not show pathology that would require ongoing emergent intervention or inpatient treatment. I explained the diagnosis to the patient. Pain has been managed & has no complaints prior to dc. Pt is comfortable with above plan and is stable for discharge at this time. All questions were answered prior to disposition. Strict return precautions for f/u to the ED were discussed. Encouraged follow up with PCP.      Final Clinical Impressions(s) / ED Diagnoses   Final diagnoses:  RUQ pain    ED Discharge Orders        Ordered    sucralfate (CARAFATE) 1 g tablet  3 times daily with meals & bedtime      02/20/18 1907    Hyoscyamine Sulfate SL (LEVSIN/SL) 0.125 MG SUBL  3 times daily PRN     02/20/18 1907       Wallace Keller 02/20/18 1929    Pricilla Loveless, MD 02/22/18 2330

## 2018-02-20 NOTE — ED Triage Notes (Signed)
Patient states that he has had pain to his right side x 2 weeks. THe patient states that he would like to have his bloating checked out and his MD told him to come here for U/s

## 2018-02-20 NOTE — ED Notes (Signed)
ED Provider at bedside. 

## 2018-02-20 NOTE — Discharge Instructions (Addendum)
Your blood work has been very reassuring.  I would recommend taking the Carafate before meals.  Use the Levsin for any gallbladder pain.  Continue taking your pantoprazole.  Follow-up with your doctor for your HIDA scan this week however I would recommend possibly seeing a GI doctor for possible endoscopy.  Return to the ED with any worsening symptoms.

## 2018-02-20 NOTE — ED Notes (Signed)
Lab work deferred until provider able to assess pt. Pt states he had same labs and ultrasound done earlier this week

## 2018-02-21 ENCOUNTER — Encounter: Payer: Self-pay | Admitting: Gastroenterology

## 2018-02-23 ENCOUNTER — Ambulatory Visit (HOSPITAL_COMMUNITY)
Admission: RE | Admit: 2018-02-23 | Discharge: 2018-02-23 | Disposition: A | Discharge status: Home / Self Care | Payer: PRIVATE HEALTH INSURANCE | Source: Ambulatory Visit | Attending: Hematology & Oncology | Admitting: Hematology & Oncology

## 2018-02-23 DIAGNOSIS — R932 Abnormal findings on diagnostic imaging of liver and biliary tract: Secondary | ICD-10-CM | POA: Diagnosis not present

## 2018-02-23 DIAGNOSIS — R1011 Right upper quadrant pain: Secondary | ICD-10-CM | POA: Insufficient documentation

## 2018-02-23 DIAGNOSIS — R110 Nausea: Secondary | ICD-10-CM | POA: Insufficient documentation

## 2018-02-23 DIAGNOSIS — R197 Diarrhea, unspecified: Secondary | ICD-10-CM | POA: Diagnosis not present

## 2018-02-23 MED ORDER — TECHNETIUM TC 99M MEBROFENIN IV KIT
5.0900 | PACK | Freq: Once | INTRAVENOUS | Status: AC | PRN
  Administered 2018-02-23: 5.09 via INTRAVENOUS

## 2018-03-01 LAB — RBC OSMOTIC FRAGILITY

## 2018-03-02 ENCOUNTER — Encounter: Payer: Self-pay | Admitting: Gastroenterology

## 2018-03-02 ENCOUNTER — Ambulatory Visit (INDEPENDENT_AMBULATORY_CARE_PROVIDER_SITE_OTHER): Payer: PRIVATE HEALTH INSURANCE | Admitting: Gastroenterology

## 2018-03-02 VITALS — BP 126/88 | HR 96 | Ht 66.0 in | Wt 175.4 lb

## 2018-03-02 DIAGNOSIS — R1031 Right lower quadrant pain: Secondary | ICD-10-CM

## 2018-03-02 DIAGNOSIS — K589 Irritable bowel syndrome without diarrhea: Secondary | ICD-10-CM | POA: Diagnosis not present

## 2018-03-02 DIAGNOSIS — R1011 Right upper quadrant pain: Secondary | ICD-10-CM

## 2018-03-02 DIAGNOSIS — G8929 Other chronic pain: Secondary | ICD-10-CM

## 2018-03-02 MED ORDER — DEXLANSOPRAZOLE 60 MG PO CPDR: 60.0000 mg | DELAYED_RELEASE_CAPSULE | Freq: Every day | ORAL | 0 refills | Status: DC

## 2018-03-02 MED ORDER — DICYCLOMINE HCL 10 MG PO CAPS: 10.0000 mg | ORAL_CAPSULE | Freq: Four times a day (QID) | ORAL | 0 refills | Status: DC

## 2018-03-02 NOTE — Progress Notes (Signed)
Chief Complaint: Abdominal pain  Referring Provider:  Dr Myna Hidalgo      ASSESSMENT AND PLAN;   #1.  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 Ned CT 12/2015 #2.  IBS with diarrhea, r/o other causes. #3. RLQ pain with abdominal bloating -could be related to IBS, rule out other causes. #4. GERD.  Plan: Trial of Dexilant 60 mg p.o. once a day.  Can hold Protonix for now. Samples given. Proceed with EGD with SB Bx Trial of librax 1 tab po tid (his dad takes that). Sedation/driving precautions. Proceed with CT abdomen/pelvis for evaluation of right lower quadrant abdominal pain. I would like to hold off on colonoscopy at the present time since I believe that the yield will be low.  However, if he continues to have symptoms, we will proceed with colonoscopy at a later date.  Patient and patient's sister do agree with the plan. If EGD is negative, then can consider laparoscopic cholecystectomy for biliary dyskinesia. He does understand that cholecystectomy would make the diarrhea worse. Continue current low-fat diet. If he continues to have problems, we will give him a trial of gluten-free diet. Follow-up in 6 weeks.   HPI:    Clarence Hines is a 32 y.o. male  RUQ pain x 3 weeks Made worse with eating especially fatty meals. No significant nausea but does have abdominal bloating. Has burning type of pain. Has been taking Protonix on a as needed basis for reflux which he had for several years. Has been having mushy bowel movements intermittently over the last 1 to 2 years, no nocturnal symptoms.  Bowel movements occur after eating at frequency of 4-5 bowel movements per day, with some relief of pain.  No nausea, vomiting, heartburn, regurgitation, odynophagia or dysphagia.  No significant  constipation.  There is no melena or hematochezia. No unintentional weight loss. During  the course of work-up he had negative ultrasound but a positive HIDA scan with some reproduction of symptoms after ensure. Has been advised to get laparoscopic cholecystectomy. Also complains of right lower quadrant abdominal pain and right middle abdominal pain worse over the last few weeks, relieved to some extent with defecation.  Has abdominal bloating with borborygmi. He does admit that the right lower quadrant abdominal pain gets worse with stress   Past Medical History:  Diagnosis Date  . Hereditary spherocytosis (HCC)   . Hypertension     Past Surgical History:  Procedure Laterality Date  . APPENDECTOMY      History reviewed. No pertinent family history.  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  Mom and dad also have IBS Tobacco Use  . Smoking status: Never Smoker  . Smokeless tobacco: Never Used  Substance Use Topics  . Alcohol use: No    Alcohol/week: 0.0 oz  . Drug use: No    Current Outpatient Medications  Medication Sig Dispense Refill  . B Complex-C (B-COMPLEX WITH VITAMIN C) tablet Take 1 tablet by mouth daily.    . folic acid (FOLVITE) 1 MG tablet Take 5 mg by mouth daily.    . metoprolol succinate (TOPROL-XL) 25 MG 24 hr tablet Take 25 mg by mouth daily.    Marland Kitchen telmisartan (MICARDIS) 40 MG tablet Take 40 mg by mouth daily.    Marland Kitchen Hyoscyamine Sulfate SL (LEVSIN/SL) 0.125 MG SUBL Place 0.125  mg under the tongue 3 (three) times daily as needed. (Patient not taking: Reported on 03/02/2018) 30 each 0  . sucralfate (CARAFATE) 1 g tablet Take 1 tablet (1 g total) by mouth 4 (four) times daily -  with meals and at bedtime. (Patient not taking: Reported on 03/02/2018) 60 tablet 0   No current facility-administered medications for this visit.     Allergies  Allergen Reactions  . Sulfa Antibiotics     unknown    Review of Systems:  Constitutional: Denies fever, chills, diaphoresis, appetite change  and fatigue.  HEENT: Denies photophobia, eye pain, redness, hearing loss, ear pain, congestion, sore throat, rhinorrhea, sneezing, mouth sores, neck pain, neck stiffness and tinnitus.   Respiratory: Denies SOB, DOE, cough, chest tightness,  and wheezing.   Cardiovascular: Denies chest pain, palpitations and leg swelling.  Genitourinary: Denies dysuria, urgency, frequency, hematuria, flank pain and difficulty urinating.  Musculoskeletal: Denies myalgias, back pain, joint swelling, arthralgias and gait problem.  Skin: No rash.  Neurological: Denies dizziness, seizures, syncope, weakness, light-headedness, numbness and headaches.  Hematological: Denies adenopathy. Easy bruising, personal or family bleeding history  Psychiatric/Behavioral: No anxiety or depression     Physical Exam:    BP 126/88   Pulse 96   Ht  (1.676 m)   Wt 175 lb 6 oz (79.5 kg)   BMI 28.31 kg/m  Filed Weights   03/02/18 1456  Weight: 175 lb 6 oz (79.5 kg)   Constitutional:  Well-developed, in no acute distress. Psychiatric: Normal mood and affect. Behavior is normal. HEENT: Pupils normal.  Conjunctivae are normal. No scleral icterus. Neck supple.  Cardiovascular: Normal rate, regular rhythm. No edema Pulmonary/chest: Effort normal and breath sounds normal. No wheezing, rales or rhonchi. Abdominal: Soft, nondistended.  Mild right upper quadrant and right lower quadrant abdominal tenderness. Bowel sounds active throughout. There are no masses palpable. No hepatomegaly. Rectal:  defered Neurological: Alert and oriented to person place and time. Skin: Skin is warm and dry. No rashes noted.  Data Reviewed: I have personally reviewed following labs and imaging studies  CBC: CBC Latest Ref Rng & Units 02/20/2018 02/16/2018 02/11/2017  WBC 4.0 - 10.5 K/uL 9.9 10.2(H) 9.6  Hemoglobin 13.0 - 17.0 g/dL 11.7(L) 11.8(L) 11.8(L)  Hematocrit 39.0 - 52.0 % 33.5(L) 34.6(L) 33.9(L)  Platelets 150 - 400 K/uL 164 166 164     CMP: CMP Latest Ref Rng & Units 02/20/2018 02/16/2018 02/22/2017  Glucose 65 - 99 mg/dL 811(B) 147 829  BUN 6 - 20 mg/dL 8 9 56.2  Creatinine 1.30 - 1.24 mg/dL 8.65 7.84 0.9  Sodium 696 - 145 mmol/L 140 143 140  Potassium 3.5 - 5.1 mmol/L 3.9 3.7 3.7  Chloride 101 - 111 mmol/L 109 110(H) -  CO2 22 - 32 mmol/L Calcium 8.9 - 10.3 mg/dL 9.3 9.7 9.4  Total Protein 6.5 - 8.1 g/dL 7.2 7.3 7.2  Total Bilirubin 0.3 - 1.2 mg/dL 2.9(B) 2.4(H) 2.64(H)  Alkaline Phos 38 - 126 U/L 64 80 84  AST 15 - 41 U/L ALT 17 - 63 U/L 23 37 34     Radiology Studies: US Abdomen Complete  Result Date: 02/20/2018 CLINICAL DATA:  Patient with right upper quadrant abdominal pain. History of splenomegaly. EXAM: ABDOMEN ULTRASOUND COMPLETE COMPARISON:  Abdominal ultrasound 05/21/2017. FINDINGS: Gallbladder: No gallbladder wall thickening or pericholecystic fluid. Negative sonographic Murphy's sign. 3 mm polyp within the gallbladder lumen. Common bile duct: Diameter: 3 mm Liver:  Increase in echogenicity. No focal lesion identified. Portal vein is patent on color Doppler imaging with normal direction of blood flow towards the liver. IVC: No abnormality visualized. Pancreas: Visualized portion unremarkable. Spleen: 14.9 cm. Right Kidney: Length: 10.4 cm. Normal renal cortical thickness and echogenicity. There is a subcentimeter echogenic lesion within the superior pole which may represent a small angiomyolipoma. Left Kidney: Length: 11.1 cm. Echogenicity within normal limits. No mass or hydronephrosis visualized. Abdominal aorta: No aneurysm visualized. Other findings: None. IMPRESSION: Hepatic steatosis. Splenomegaly. 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 Consider follow-up ultrasound in 12 months to ensure stability. Electronically Signed   By: Annia Belt M.D.   On: 02/20/2018 15:10   Nm Hepato W/eject Fract  Result Date:  02/23/2018 CLINICAL DATA:  Right upper quadrant pain and diarrhea EXAM: NUCLEAR MEDICINE HEPATOBILIARY IMAGING WITH GALLBLADDER EF VIEWS: Anterior right upper quadrant RADIOPHARMACEUTICALS:  5.09 mCi Tc-45m  Choletec IV COMPARISON:  None. FINDINGS: Liver uptake of radiotracer is normal. There is prompt visualization of gallbladder and small bowel, indicating patency of the cystic and common bile ducts. The patient consumed 8 ounces of Ensure orally with calculation of the computer generated ejection fraction of radiotracer from the gallbladder. The patient did experience abdominal cramping with the oral Ensure consumption. The computer generated ejection fraction of radiotracer from the gallbladder is abnormally low at 18.8%, normal greater than 33% using the oral agent. IMPRESSION: Abnormally low ejection fraction of radiotracer from the gallbladder, a finding felt to indicate a degree of biliary dyskinesia. Patient did experience abdominal cramping with the oral Ensure consumption. Cystic and common bile ducts are patent as is evidenced by visualization of gallbladder and small bowel. Electronically Signed   By: Bretta Bang III M.D.   On: 02/23/2018 14:03      Edman Circle, MD 03/02/2018, 3:09 PM  Cc: Dr Myna Hidalgo

## 2018-03-02 NOTE — Patient Instructions (Addendum)
If you are age 34 or older, your body mass index should be between 23-30. Your Body mass index is 28.31 kg/m. If this is out of the aforementioned range listed, please consider follow up with your Primary Care Provider.  If you are age 83 or younger, your body mass index should be between 19-25. Your Body mass index is 28.31 kg/m. If this is out of the aformentioned range listed, please consider follow up with your Primary Care Provider.    You have been scheduled for a CT scan of the abdomen and pelvis at [place] are scheduled on 03/10/18 at [facility] should arrive 15 minutes prior to your appointment time for registration. Please follow the written instructions below on the day of your exam:  WARNING: IF YOU ARE ALLERGIC TO IODINE/X-RAY DYE, PLEASE NOTIFY RADIOLOGY IMMEDIATELY AT [id number]! YOU WILL BE GIVEN A 13 HOUR PREMEDICATION PREP.  1) Do not eat or drink anything after 7am (4 hours prior to your test) 2) You have been given 2 bottles of oral contrast to drink. The solution may taste better if refrigerated, but do NOT add ice or any other liquid to this solution. Shake well before drinking.    Drink 1 bottle of contrast @ 9am (2 hours prior to your exam)  Drink 1 bottle of contrast @ 8am (1 hour prior to your exam)  You may take any medications as prescribed with a small amount of water except for the following: Metformin, Glucophage, Glucovance, Avandamet, Riomet, Fortamet, Actoplus Met, Janumet, Glumetza or Metaglip. The above medications must be held the day of the exam AND 48 hours after the exam.  The purpose of you drinking the oral contrast is to aid in the visualization of your intestinal tract. The contrast solution may cause some diarrhea. Before your exam is started, you will be given a small amount of fluid to drink. Depending on your individual set of symptoms, you may also receive an intravenous injection of x-ray contrast/dye. Plan on being at [facility] for 30 minutes or longer, depending on the type of exam you are having performed.  This test typically takes 30-45 minutes to complete.  If you have any questions regarding your exam or if you need to reschedule, you may call the CT department at [phone] between the hours of 8:00 am and 5:00 pm, Monday-Friday.  ________________________________________________________________________   Clarence Hines have been scheduled for an endoscopy. Please follow written instructions given to you at your visit today. If you use inhalers (even only as needed), please bring them with you on the day of your procedure. Your physician has requested that you go to www.startemmi.com and enter the access code given to you at your visit today. This web site gives a general overview about your procedure. However, you should still follow specific instructions given to you by our office regarding your preparation for the procedure.  We have given you samples of the following medication to take: Dexilant one capsule once daily.   We have sent the following medications to your pharmacy for you to pick up at your convenience: Diyclomine 10 mg by mouth four times daily.   Thank you,  Dr. Jackquline Denmark   .

## 2018-03-03 ENCOUNTER — Encounter: Payer: Self-pay | Admitting: Gastroenterology

## 2018-03-03 NOTE — Addendum Note (Signed)
Addended by: Oliver Barre D on: 03/03/2018 08:44 AM   Modules accepted: Orders

## 2018-03-07 ENCOUNTER — Ambulatory Visit (HOSPITAL_BASED_OUTPATIENT_CLINIC_OR_DEPARTMENT_OTHER)
Admission: RE | Admit: 2018-03-07 | Discharge: 2018-03-07 | Disposition: A | Discharge status: Home / Self Care | Payer: PRIVATE HEALTH INSURANCE | Source: Ambulatory Visit | Attending: Gastroenterology | Admitting: Gastroenterology

## 2018-03-07 ENCOUNTER — Encounter (HOSPITAL_BASED_OUTPATIENT_CLINIC_OR_DEPARTMENT_OTHER): Payer: Self-pay

## 2018-03-07 ENCOUNTER — Ambulatory Visit (AMBULATORY_SURGERY_CENTER): Payer: PRIVATE HEALTH INSURANCE | Admitting: Gastroenterology

## 2018-03-07 ENCOUNTER — Other Ambulatory Visit: Payer: Self-pay

## 2018-03-07 ENCOUNTER — Encounter: Payer: Self-pay | Admitting: Gastroenterology

## 2018-03-07 VITALS — BP 115/68 | HR 85 | Temp 99.1°F | Resp 18 | Ht 66.0 in | Wt 175.0 lb

## 2018-03-07 DIAGNOSIS — K2970 Gastritis, unspecified, without bleeding: Secondary | ICD-10-CM

## 2018-03-07 DIAGNOSIS — R161 Splenomegaly, not elsewhere classified: Secondary | ICD-10-CM | POA: Insufficient documentation

## 2018-03-07 DIAGNOSIS — G8929 Other chronic pain: Secondary | ICD-10-CM

## 2018-03-07 DIAGNOSIS — K589 Irritable bowel syndrome without diarrhea: Secondary | ICD-10-CM | POA: Insufficient documentation

## 2018-03-07 DIAGNOSIS — R1011 Right upper quadrant pain: Secondary | ICD-10-CM

## 2018-03-07 DIAGNOSIS — R1031 Right lower quadrant pain: Secondary | ICD-10-CM | POA: Insufficient documentation

## 2018-03-07 MED ORDER — DEXLANSOPRAZOLE 60 MG PO CPDR: 60.0000 mg | DELAYED_RELEASE_CAPSULE | Freq: Every day | ORAL | 3 refills | Status: DC

## 2018-03-07 MED ORDER — SODIUM CHLORIDE 0.9 % IV SOLN: 500.0000 mL | Freq: Once | INTRAVENOUS | Status: DC

## 2018-03-07 MED ORDER — IOPAMIDOL (ISOVUE-300) INJECTION 61%
100.0000 mL | Freq: Once | INTRAVENOUS | Status: AC | PRN
  Administered 2018-03-07: 100 mL via INTRAVENOUS

## 2018-03-07 NOTE — Patient Instructions (Signed)
YOU HAD AN ENDOSCOPIC PROCEDURE TODAY: Refer to the procedure report and other information in the discharge instructions given to you for any specific questions about what was found during the examination. If this information does not answer your questions, please call [place] office at [phone] to clarify.   YOU SHOULD EXPECT: Some feelings of bloating in the abdomen. Passage of more gas than usual. Walking can help get rid of the air that was put into your GI tract during the procedure and reduce the bloating. If you had a lower endoscopy (such as a colonoscopy or flexible sigmoidoscopy) you may notice spotting of blood in your stool or on the toilet paper. Some abdominal soreness may be present for a day or two, also.  DIET: Your first meal following the procedure should be a light meal and then it is ok to progress to your normal diet. A half-sandwich or bowl of soup is an example of a good first meal. Heavy or fried foods are harder to digest and may make you feel nauseous or bloated. Drink plenty of fluids but you should avoid alcoholic beverages for 24 hours. If you had a esophageal dilation, please see attached instructions for diet.    ACTIVITY: Your care partner should take you home directly after the procedure. You should plan to take it easy, moving slowly for the rest of the day. You can resume normal activity the day after the procedure however YOU SHOULD NOT DRIVE, use power tools, machinery or perform tasks that involve climbing or major physical exertion for 24 hours (because of the sedation medicines used during the test).   SYMPTOMS TO REPORT IMMEDIATELY: A gastroenterologist can be reached at any hour. Please call [phone]  for any of the following symptoms:   Following upper endoscopy (EGD, EUS, ERCP, esophageal dilation) Vomiting of blood or coffee ground material  New, significant abdominal pain  New, significant chest pain or pain under the shoulder blades  Painful or  persistently difficult swallowing  New shortness of breath  Black, tarry-looking or red, bloody stools  FOLLOW UP:  If any biopsies were taken you will be contacted by phone or by letter within the next 1-3 weeks. Call [phone]  if you have not heard about the biopsies in 3 weeks.  Please also call with any specific questions about appointments or follow up tests.  

## 2018-03-07 NOTE — Progress Notes (Signed)
Called to room to assist during endoscopic procedure.  Patient ID and intended procedure confirmed with present staff. Received instructions for my participation in the procedure from the performing physician.  

## 2018-03-07 NOTE — Op Note (Signed)
[place] Patient Name: Minor Iden Procedure Date: 03/07/2018 9:42 AM MRN: [id number] Endoscopist: Jackquline Denmark , MD Age: 32 Referring MD:  Date of Birth: [birth year] Gender: Male Account #: [id number] Procedure:                Upper GI endoscopy Indications:              Abdominal pain in the right upper quadrant Medicines:                Monitored Anesthesia Care Procedure:                Pre-Anesthesia Assessment:                           - Prior to the procedure, a History and Physical                            was performed, and patient medications and                            allergies were reviewed. The patient is competent.                            The risks and benefits of the procedure and the                            sedation options and risks were discussed with the                            patient. All questions were answered and informed                            consent was obtained. Patient identification and                            proposed procedure were verified by the physician                            in the procedure room. Mental Status Examination:                            alert and oriented. Prophylactic Antibiotics: The                            patient does not require prophylactic antibiotics.                            Prior Anticoagulants: The patient has taken no                            previous anticoagulant or antiplatelet agents. ASA                            Grade Assessment: I - A normal, healthy patient.  After reviewing the risks and benefits, the patient                            was deemed in satisfactory condition to undergo the                            procedure. The anesthesia plan was to use monitored                            anesthesia care (MAC). Immediately prior to                            administration of medications, the patient was   re-assessed for adequacy to receive sedatives. The                            heart rate, respiratory rate, oxygen saturations,                            blood pressure, adequacy of pulmonary ventilation,                            and response to care were monitored throughout the                            procedure. The physical status of the patient was                            re-assessed after the procedure.                           After obtaining informed consent, the endoscope was                            passed under direct vision. Throughout the                            procedure, the patient's blood pressure, pulse, and                            oxygen saturations were monitored continuously. The                            Endoscope was introduced through the mouth, and                            advanced to the second part of duodenum. The upper                            GI endoscopy was accomplished without difficulty.                            The patient tolerated the procedure well. Scope In: Scope Out: Findings:  Localized moderate inflammation characterized by                            erythema was found in the gastric antrum. Biopsies                            were taken with a cold forceps for histology.                           The in the duodenum was normal. Biopsies for                            histology were taken with a cold forceps for                            evaluation of celiac disease.                           The exam was otherwise without abnormality. Complications:            No immediate complications. Estimated Blood Loss:     Estimated blood loss: none. Impression:               - Acute gastritis. Biopsied.                           - Normal. Biopsied.                           - The examination was otherwise normal. Recommendation:           - Resume previous diet.                           - Continue present  medications. Dexilant 60 mg by                            mouth once a day x 8 weeks. Continue dicyclomine                            when necessary                           - Await pathology results.                           - Return to GI office in 8 weeks.                           - Avoid nonsteroidals. Jackquline Denmark, MD 03/07/2018 10:08:17 AM This report has been signed electronically.

## 2018-03-07 NOTE — Progress Notes (Signed)
Pt's states no medical or surgical changes since previsit or office visit. 

## 2018-03-07 NOTE — Progress Notes (Signed)
To recovery, report to RN, VSS. 

## 2018-03-08 ENCOUNTER — Telehealth: Payer: Self-pay | Admitting: *Deleted

## 2018-03-08 NOTE — Telephone Encounter (Signed)
  Follow up Call-  Call back number 03/07/2018  Post procedure Call Back phone  # [phone]  Permission to leave phone message Yes  Some recent data might be hidden     Patient questions:  Do you have a fever, pain , or abdominal swelling? No. Pain Score  0 *  Have you tolerated food without any problems? Yes.    Have you been able to return to your normal activities? Yes.    Do you have any questions about your discharge instructions: Diet   No. Medications  No. Follow up visit  No.  Do you have questions or concerns about your Care? No.  Actions: * If pain score is 4 or above: No action needed, pain <4.

## 2018-03-10 ENCOUNTER — Other Ambulatory Visit (HOSPITAL_BASED_OUTPATIENT_CLINIC_OR_DEPARTMENT_OTHER): Payer: Self-pay

## 2018-03-11 ENCOUNTER — Telehealth: Payer: Self-pay | Admitting: Gastroenterology

## 2018-03-11 NOTE — Telephone Encounter (Signed)
Pt calling regarding path results. He stated to have received a message that results were available in [facility] but he said that it is not. Pls call him.

## 2018-03-11 NOTE — Telephone Encounter (Signed)
I returned his call concerning biopsy results which are on his chart but I didn't give him the result since he was positive for h.pylori and I did not know what the treatment plan was.  Please advise.  He would also like your opinion on the result of his HIDA scan and the recommendation that he have his gallbladder removed.  He states he is symptom free at this time. I did make his 8-week follow-up appointment per the procedure note.   

## 2018-03-14 ENCOUNTER — Telehealth: Payer: Self-pay

## 2018-03-14 ENCOUNTER — Other Ambulatory Visit: Payer: Self-pay

## 2018-03-14 ENCOUNTER — Encounter: Payer: Self-pay | Admitting: Gastroenterology

## 2018-03-14 DIAGNOSIS — A048 Other specified bacterial intestinal infections: Secondary | ICD-10-CM

## 2018-03-14 MED ORDER — CLARITHROMYCIN 500 MG PO TABS: 500.0000 mg | ORAL_TABLET | Freq: Two times a day (BID) | ORAL | 0 refills | Status: DC

## 2018-03-14 MED ORDER — AMOXICILLIN 500 MG PO TABS: ORAL_TABLET | ORAL | 0 refills | Status: DC

## 2018-03-14 NOTE — Telephone Encounter (Signed)
I spoke with the patient and gave him the pathology results.  No PCP listed in chart.  H pylori treatment sent to his pharmacy.

## 2018-03-14 NOTE — Telephone Encounter (Signed)
The H. Pylori test just came back to be positive Continue please call him: Start the Dexilant 60 mg by mouth once a day, Biaxin 500 mg by mouth twice a day and Amox 1g  by mouth twice a day for 14 days, then he will continue on Dexilant 60mg  po qd until FU. Hold off on getting GB out at this time.

## 2018-03-14 NOTE — Telephone Encounter (Signed)
I returned his call concerning biopsy results which are on his chart but I didn't give him the result since he was positive for h.pylori and I did not know what the treatment plan was.  Please advise.  He would also like your opinion on the result of his HIDA scan and the recommendation that he have his gallbladder removed.  He states he is symptom free at this time. I did make his 8-week follow-up appointment per the procedure note.

## 2018-03-14 NOTE — Telephone Encounter (Signed)
Patient says that he is returning someone's phone call. [facility] # [phone]

## 2018-03-17 ENCOUNTER — Ambulatory Visit: Payer: Self-pay | Admitting: Hematology & Oncology

## 2018-03-17 ENCOUNTER — Other Ambulatory Visit: Payer: Self-pay

## 2018-04-19 ENCOUNTER — Emergency Department (HOSPITAL_BASED_OUTPATIENT_CLINIC_OR_DEPARTMENT_OTHER): Payer: PRIVATE HEALTH INSURANCE

## 2018-04-19 ENCOUNTER — Emergency Department (HOSPITAL_BASED_OUTPATIENT_CLINIC_OR_DEPARTMENT_OTHER)
Admission: EM | Admit: 2018-04-19 | Discharge: 2018-04-19 | Disposition: A | Discharge status: Home / Self Care | Payer: PRIVATE HEALTH INSURANCE | Source: Home / Self Care | Attending: Emergency Medicine | Admitting: Emergency Medicine

## 2018-04-19 ENCOUNTER — Emergency Department (HOSPITAL_BASED_OUTPATIENT_CLINIC_OR_DEPARTMENT_OTHER)
Admission: EM | Admit: 2018-04-19 | Discharge: 2018-04-20 | Disposition: A | Discharge status: Home / Self Care | Payer: PRIVATE HEALTH INSURANCE | Attending: Emergency Medicine | Admitting: Emergency Medicine

## 2018-04-19 ENCOUNTER — Encounter (HOSPITAL_BASED_OUTPATIENT_CLINIC_OR_DEPARTMENT_OTHER): Payer: Self-pay | Admitting: *Deleted

## 2018-04-19 ENCOUNTER — Encounter (HOSPITAL_BASED_OUTPATIENT_CLINIC_OR_DEPARTMENT_OTHER): Payer: Self-pay | Admitting: Emergency Medicine

## 2018-04-19 ENCOUNTER — Other Ambulatory Visit: Payer: Self-pay

## 2018-04-19 DIAGNOSIS — F419 Anxiety disorder, unspecified: Secondary | ICD-10-CM | POA: Diagnosis not present

## 2018-04-19 DIAGNOSIS — Z79899 Other long term (current) drug therapy: Secondary | ICD-10-CM | POA: Diagnosis not present

## 2018-04-19 DIAGNOSIS — I10 Essential (primary) hypertension: Secondary | ICD-10-CM | POA: Diagnosis not present

## 2018-04-19 DIAGNOSIS — R197 Diarrhea, unspecified: Secondary | ICD-10-CM | POA: Insufficient documentation

## 2018-04-19 DIAGNOSIS — R1110 Vomiting, unspecified: Secondary | ICD-10-CM

## 2018-04-19 DIAGNOSIS — R112 Nausea with vomiting, unspecified: Secondary | ICD-10-CM | POA: Diagnosis present

## 2018-04-19 DIAGNOSIS — K529 Noninfective gastroenteritis and colitis, unspecified: Secondary | ICD-10-CM | POA: Diagnosis not present

## 2018-04-19 DIAGNOSIS — R109 Unspecified abdominal pain: Secondary | ICD-10-CM

## 2018-04-19 LAB — CBC WITH DIFFERENTIAL/PLATELET
BASOS PCT: 1 %
Basophils Absolute: 0.1 10*3/uL (ref 0.0–0.1)
EOS ABS: 0.1 10*3/uL (ref 0.0–0.7)
Eosinophils Relative: 1 %
HEMATOCRIT: 36.1 % — AB (ref 39.0–52.0)
Hemoglobin: 12.4 g/dL — ABNORMAL LOW (ref 13.0–17.0)
Lymphocytes Relative: 21 %
Lymphs Abs: 2.7 10*3/uL (ref 0.7–4.0)
MCH: 28 pg (ref 26.0–34.0)
MCHC: 34.3 g/dL (ref 30.0–36.0)
MCV: 81.5 fL (ref 78.0–100.0)
MONO ABS: 0.9 10*3/uL (ref 0.1–1.0)
Monocytes Relative: 7 %
NEUTROS ABS: 9.2 10*3/uL — AB (ref 1.7–7.7)
NEUTROS PCT: 70 %
Platelets: 188 10*3/uL (ref 150–400)
RBC: 4.43 MIL/uL (ref 4.22–5.81)
RDW: 24 % — AB (ref 11.5–15.5)
WBC: 13 10*3/uL — ABNORMAL HIGH (ref 4.0–10.5)

## 2018-04-19 LAB — URINALYSIS, ROUTINE W REFLEX MICROSCOPIC
Glucose, UA: NEGATIVE mg/dL
Hgb urine dipstick: NEGATIVE
KETONES UR: NEGATIVE mg/dL
LEUKOCYTES UA: NEGATIVE
NITRITE: NEGATIVE
PROTEIN: NEGATIVE mg/dL
Specific Gravity, Urine: >1.03 — ABNORMAL HIGH (ref 1.005–1.030)
pH: 5 (ref 5.0–8.0)

## 2018-04-19 LAB — COMPREHENSIVE METABOLIC PANEL
ALT: 29 U/L (ref 0–44)
ANION GAP: 7 (ref 5–15)
AST: 27 U/L (ref 15–41)
Albumin: 5.1 g/dL — ABNORMAL HIGH (ref 3.5–5.0)
Alkaline Phosphatase: 78 U/L (ref 38–126)
BILIRUBIN TOTAL: 2.2 mg/dL — AB (ref 0.3–1.2)
BUN: 13 mg/dL (ref 6–20)
CO2: 27 mmol/L (ref 22–32)
Calcium: 9.5 mg/dL (ref 8.9–10.3)
Chloride: 105 mmol/L (ref 98–111)
Creatinine, Ser: 0.82 mg/dL (ref 0.61–1.24)
Glucose, Bld: 122 mg/dL — ABNORMAL HIGH (ref 70–99)
Potassium: 3.6 mmol/L (ref 3.5–5.1)
Sodium: 139 mmol/L (ref 135–145)
TOTAL PROTEIN: 8.1 g/dL (ref 6.5–8.1)

## 2018-04-19 LAB — LIPASE, BLOOD: LIPASE: 41 U/L (ref 11–51)

## 2018-04-19 MED ORDER — ACETAMINOPHEN 325 MG PO TABS
650.0000 mg | ORAL_TABLET | Freq: Once | ORAL | Status: AC
  Administered 2018-04-19: 650 mg via ORAL
  Filled 2018-04-19: qty 2

## 2018-04-19 MED ORDER — SODIUM CHLORIDE 0.9 % IV BOLUS (SEPSIS)
1000.0000 mL | Freq: Once | INTRAVENOUS | Status: AC
  Administered 2018-04-19: 1000 mL via INTRAVENOUS

## 2018-04-19 MED ORDER — IOPAMIDOL (ISOVUE-300) INJECTION 61%
100.0000 mL | Freq: Once | INTRAVENOUS | Status: AC | PRN
  Administered 2018-04-19: 100 mL via INTRAVENOUS

## 2018-04-19 MED ORDER — PROMETHAZINE HCL 25 MG/ML IJ SOLN
12.5000 mg | Freq: Once | INTRAMUSCULAR | Status: AC
  Administered 2018-04-19: 12.5 mg via INTRAVENOUS
  Filled 2018-04-19: qty 1

## 2018-04-19 MED ORDER — SODIUM CHLORIDE 0.9 % IV SOLN
1000.0000 mL | INTRAVENOUS | Status: DC
  Administered 2018-04-19: 1000 mL via INTRAVENOUS

## 2018-04-19 MED ORDER — ONDANSETRON HCL 4 MG/2ML IJ SOLN
4.0000 mg | Freq: Once | INTRAMUSCULAR | Status: AC
  Administered 2018-04-19: 4 mg via INTRAVENOUS
  Filled 2018-04-19: qty 2

## 2018-04-19 MED ORDER — ONDANSETRON 4 MG PO TBDP: 4.0000 mg | ORAL_TABLET | Freq: Once | ORAL | Status: DC

## 2018-04-19 MED ORDER — ONDANSETRON 4 MG PO TBDP: ORAL_TABLET | ORAL | 0 refills | Status: DC

## 2018-04-19 MED ORDER — IOPAMIDOL (ISOVUE-300) INJECTION 61%: 15.0000 mL | Freq: Once | INTRAVENOUS | Status: DC | PRN

## 2018-04-19 NOTE — ED Notes (Signed)
Pt given a sprite 

## 2018-04-19 NOTE — ED Triage Notes (Signed)
Pt states he woke up at 3 am with abd pain and has had 2 episodes of vomiting and 4 episodes of watery diarrhea

## 2018-04-19 NOTE — ED Provider Notes (Signed)
[facility] EMERGENCY DEPARTMENT Provider Note   CSN: [id number] Arrival date & time: 04/19/18  0331     History   Chief Complaint Chief Complaint  Patient presents with  . Abdominal Pain  . Emesis  . Diarrhea    HPI Clarence Hines is a 32 y.o. male.  Patient awoke from sleep around 3 AM with abdominal cramping, diarrhea followed by nausea and vomiting.  States he has had about 3-4 episodes of loose watery stools and 2 episodes of vomiting.  He did have some abdominal cramping before bed for which he took a pantoprazole and Bentyl.  Patient does have a history of GI issues including recent EGD that showed gastritis and H. pylori treatment he completed 2 weeks ago.  He was also found to have biliary dyskinesia but was told he did not need to have his gallbladder out at this time.  Patient feels this episode is different than his usual right upper quadrant pain.  He has pain diffusely in his abdomen that feels like cramping that comes and goes.  He has not had any fevers or chills.  No pain with urination or blood in the urine.  He believes he ate some bad leftover lamb which is making him sick.  No recent out of the country travel or sick contacts.  Last antibiotic use was 2 weeks ago with H. pylori treatment.  The history is provided by the patient.  Abdominal Pain   Associated symptoms include diarrhea and vomiting. Pertinent negatives include fever, dysuria, hematuria and headaches.  Emesis   Associated symptoms include abdominal pain and diarrhea. Pertinent negatives include no cough, no fever and no headaches.  Diarrhea   Associated symptoms include abdominal pain and vomiting. Pertinent negatives include no headaches and no cough.    Past Medical History:  Diagnosis Date  . Anxiety   . Hereditary spherocytosis (HCC)   . Hypertension     Patient Active Problem List   Diagnosis Date Noted  . Right knee pain 06/22/2016  . Anal fissure 01/09/2016  . Neck  injury 07/09/2015  . Sinus tachycardia 05/24/2015  . Awareness of heartbeats 05/23/2015  . BP (high blood pressure) 05/11/2014  . Hereditary spherocytosis (HCC) 05/11/2014    Past Surgical History:  Procedure Laterality Date  . APPENDECTOMY    . GASTROPLASTY    . SPHINCTEROTOMY          Home Medications    Prior to Admission medications   Medication Sig Start Date End Date Taking? Authorizing Provider  busPIRone (BUSPAR) 5 MG tablet Take 5 mg by mouth 2 (two) times daily.   Yes [provider]  dexlansoprazole (DEXILANT) 60 MG capsule Take 1 capsule (60 mg total) by mouth daily. 03/02/18  Yes Lynann Bologna, MD  dexlansoprazole (DEXILANT) 60 MG capsule Take 1 capsule (60 mg total) by mouth daily. 03/07/18  Yes Lynann Bologna, MD  folic acid (FOLVITE) 1 MG tablet Take 5 mg by mouth daily.   Yes [provider]  metoprolol succinate (TOPROL-XL) 25 MG 24 hr tablet Take 25 mg by mouth daily.   Yes [provider]  telmisartan (MICARDIS) 40 MG tablet Take 40 mg by mouth daily.   Yes [provider]  amoxicillin (AMOXIL) 500 MG tablet Take 2 tablets (1000 mg) by mouth twice daily for 14 days 03/14/18   Lynann Bologna, MD  B Complex-C (B-COMPLEX WITH VITAMIN C) tablet Take 1 tablet by mouth daily.    [provider]  clarithromycin (BIAXIN) 500 MG tablet Take 1 tablet (500 mg total) by mouth 2 (two) times daily. 03/14/18   Lynann BolognaGupta, Rajesh, MD  dicyclomine (BENTYL) 10 MG capsule Take 1 capsule (10 mg total) by mouth 4 (four) times daily. 03/02/18   Lynann BolognaGupta, Rajesh, MD  Hyoscyamine Sulfate SL (LEVSIN/SL) 0.125 MG SUBL Place 0.125 mg under the tongue 3 (three) times daily as needed. Patient not taking: Reported on 03/02/2018 02/20/18   Demetrios LollLeaphart, Kenneth T, PA-C  sucralfate (CARAFATE) 1 g tablet Take 1 tablet (1 g total) by mouth 4 (four) times daily -  with meals and at bedtime. 02/20/18   Rise MuLeaphart, Kenneth T, PA-C    Family History Family History  Problem  Relation Age of Onset  . Hypertension Mother   . Hypertension Father     Social History Social History   Tobacco Use  . Smoking status: Never Smoker  . Smokeless tobacco: Never Used  Substance Use Topics  . Alcohol use: No    Alcohol/week: 0.0 oz  . Drug use: No     Allergies   Sulfa antibiotics   Review of Systems Review of Systems  Constitutional: Positive for activity change and appetite change. Negative for fever.  HENT: Negative for congestion and rhinorrhea.   Eyes: Negative for visual disturbance.  Respiratory: Negative for cough, chest tightness and shortness of breath.   Gastrointestinal: Positive for abdominal pain, diarrhea and vomiting.  Genitourinary: Negative for dysuria and hematuria.  Musculoskeletal: Negative for back pain.  Skin: Negative for rash.  Neurological: Negative for dizziness, weakness and headaches.   all other systems are negative except as noted in the HPI and PMH.     Physical Exam Updated Vital Signs BP 134/89 (BP Location: Right Arm)   Pulse 90   Temp 97.8 F (36.6 C) (Oral)   Resp 20   Ht 5\' 6"  (1.676 m)   Wt 79.4 kg (175 lb)   SpO2 100%   BMI 28.25 kg/m   Physical Exam  Constitutional: He is oriented to person, place, and time. He appears well-developed and well-nourished. No distress.  HENT:  Head: Normocephalic and atraumatic.  Mouth/Throat: Oropharynx is clear and moist. No oropharyngeal exudate.  Eyes: Pupils are equal, round, and reactive to light. Conjunctivae and EOM are normal.  Neck: Normal range of motion. Neck supple.  No meningismus.  Cardiovascular: Normal rate, regular rhythm, normal heart sounds and intact distal pulses.  No murmur heard. Pulmonary/Chest: Effort normal and breath sounds normal. No respiratory distress.  Abdominal: Soft. There is tenderness. There is no rebound and no guarding.  Mild diffuse tenderness. No guarding or rebound.  Negative murphy's sign  Musculoskeletal: Normal range of  motion. He exhibits no edema or tenderness.  Neurological: He is alert and oriented to person, place, and time. No cranial nerve deficit. He exhibits normal muscle tone. Coordination normal.  No ataxia on finger to nose bilaterally. No pronator drift. 5/5 strength throughout. CN 2-12 intact.Equal grip strength. Sensation intact.   Skin: Skin is warm.  Psychiatric: He has a normal mood and affect. His behavior is normal.  Nursing note and vitals reviewed.    ED Treatments / Results  Labs (all labs ordered are listed, but only abnormal results are displayed) Labs Reviewed  CBC WITH DIFFERENTIAL/PLATELET - Abnormal; Notable for the following components:      Result Value   WBC 13.0 (*)    Hemoglobin 12.4 (*)    HCT 36.1 (*)    RDW 24.0 (*)  Neutro Abs 9.2 (*)    All other components within normal limits  COMPREHENSIVE METABOLIC PANEL - Abnormal; Notable for the following components:   Glucose, Bld 122 (*)    Albumin 5.1 (*)    Total Bilirubin 2.2 (*)    All other components within normal limits  URINALYSIS, ROUTINE W REFLEX MICROSCOPIC - Abnormal; Notable for the following components:   Specific Gravity, Urine >1.030 (*)    Bilirubin Urine SMALL (*)    All other components within normal limits  LIPASE, BLOOD    EKG None  Radiology Dg Abdomen Acute W/chest  Result Date: 04/19/2018 CLINICAL DATA:  32 year old male with abdominal pain and vomiting. EXAM: DG ABDOMEN ACUTE W/ 1V CHEST COMPARISON:  CT of the abdomen pelvis dated 03/07/2018 FINDINGS: The lungs are clear. There is no pleural effusion or pneumothorax. The cardiac silhouette is within normal limits. Mildly dilated small bowel loops in the mid abdomen measure up to 3.3 cm in diameter. Air is noted within the colon. No free air or radiopaque calculi. The spleen appears enlarged measuring approximately 20 cm in craniocaudal length. Mild dextroscoliosis centered at L3-L4. No acute osseous pathology. IMPRESSION: Mildly dilated  small bowel loops in the central abdomen may represent and ileus or less likely an early/low grade obstruction. Enteritis is not excluded. Clinical correlation is recommended. Electronically Signed   By: Elgie Collard M.D.   On: 04/19/2018 06:28    Procedures Procedures (including critical care time)  Medications Ordered in ED Medications  sodium chloride 0.9 % bolus 1,000 mL (1,000 mLs Intravenous New Bag/Given 04/19/18 0433)    Followed by  0.9 %  sodium chloride infusion (has no administration in time range)  ondansetron (ZOFRAN) injection 4 mg (4 mg Intravenous Given 04/19/18 0433)     Initial Impression / Assessment and Plan / ED Course  I have reviewed the triage vital signs and the nursing notes.  Pertinent labs & imaging results that were available during my care of the patient were reviewed by me and considered in my medical decision making (see chart for details).    Vomiting and diarrhea since 3 AM due to suspicious food intake.  Vitals are stable.  Abdomen is soft without peritoneal signs.  Patient with recent GI visit as well as EGD which showed gastritis and was treated for H. pylori.  Also noted to have biliary dyskinesia.  Patient given symptom control and IV fluids.  Labs show mild leukocytosis similar to previous.  LFTs stable hyperbilirubinemia.  Patient continues to have diarrhea in the ED without vomiting.  Abdomen is soft but diffusely tender.  Acute abdominal series show dilated bowel loops likely due to enteritis or ileus though obstruction not excluded.  CT scan will be obtained for further evaluation. Care will be transferred at shift change to oncoming physician.     Final Clinical Impressions(s) / ED Diagnoses   Final diagnoses:  Diarrhea, unspecified type  Abdominal pain, unspecified abdominal location    ED Discharge Orders    None       Audriana Aldama, Jeannett Senior, MD 04/19/18 [phone]

## 2018-04-19 NOTE — ED Triage Notes (Signed)
Pt seen last night for N/V/D. States that the vomiting has stopped, diarrhea and cramping continues.

## 2018-04-19 NOTE — ED Notes (Signed)
Pt up to the restroom  States he is continuing to have watery diarrhea  States he has had two episodes since he has been here

## 2018-04-19 NOTE — ED Provider Notes (Signed)
[facility] Provider Note: Lowella Dell, MD, FACEP  CSN: [id number] MRN: [id number] ARRIVAL: 04/19/18 at 2246 ROOM: [facility]07/[facility]07   CHIEF COMPLAINT  Abdominal Pain   HISTORY OF PRESENT ILLNESS  04/19/18 11:07 PM Clarence Hines is a 32 y.o. male who was seen this morning for nausea, vomiting and abdominal pain.  He had a work-up that included a CT of the abdomen and pelvis.  This showed no evidence of a bowel obstruction but did show changes consistent with acute enteritis.  He returns with persistent nausea, diarrhea and abdominal cramping despite taking Imodium, Bentyl and Zofran.  He is also complaining of a headache.  He rates his pain as a 4 out of 10.    Past Medical History:  Diagnosis Date  . Anxiety   . Hereditary spherocytosis (HCC)   . Hypertension     Past Surgical History:  Procedure Laterality Date  . APPENDECTOMY    . GASTROPLASTY    . SPHINCTEROTOMY      Family History  Problem Relation Age of Onset  . Hypertension Mother   . Hypertension Father     Social History   Tobacco Use  . Smoking status: Never Smoker  . Smokeless tobacco: Never Used  Substance Use Topics  . Alcohol use: No    Alcohol/week: 0.0 oz  . Drug use: No    Prior to Admission medications   Medication Sig Start Date End Date Taking? Authorizing Provider  amoxicillin (AMOXIL) 500 MG tablet Take 2 tablets (1000 mg) by mouth twice daily for 14 days 03/14/18   Lynann Bologna, MD  B Complex-C (B-COMPLEX WITH VITAMIN C) tablet Take 1 tablet by mouth daily.    [provider]  busPIRone (BUSPAR) 5 MG tablet Take 5 mg by mouth 2 (two) times daily.    [provider]  clarithromycin (BIAXIN) 500 MG tablet Take 1 tablet (500 mg total) by mouth 2 (two) times daily. 03/14/18   Lynann Bologna, MD  dexlansoprazole (DEXILANT) 60 MG capsule Take 1 capsule (60 mg total) by mouth daily. 03/02/18   Lynann Bologna, MD  dexlansoprazole (DEXILANT) 60 MG capsule Take 1 capsule  (60 mg total) by mouth daily. 03/07/18   Lynann Bologna, MD  dicyclomine (BENTYL) 10 MG capsule Take 1 capsule (10 mg total) by mouth 4 (four) times daily. 03/02/18   Lynann Bologna, MD  folic acid (FOLVITE) 1 MG tablet Take 5 mg by mouth daily.    [provider]  Hyoscyamine Sulfate SL (LEVSIN/SL) 0.125 MG SUBL Place 0.125 mg under the tongue 3 (three) times daily as needed. Patient not taking: Reported on 03/02/2018 02/20/18   Demetrios Loll T, PA-C  metoprolol succinate (TOPROL-XL) 25 MG 24 hr tablet Take 25 mg by mouth daily.    [provider]  ondansetron (ZOFRAN ODT) 4 MG disintegrating tablet 4mg  ODT q4 hours prn nausea/vomit 04/19/18   Melene Plan, DO  sucralfate (CARAFATE) 1 g tablet Take 1 tablet (1 g total) by mouth 4 (four) times daily -  with meals and at bedtime. 02/20/18   Rise Mu, PA-C  telmisartan (MICARDIS) 40 MG tablet Take 40 mg by mouth daily.    [provider]    Allergies Sulfa antibiotics   REVIEW OF SYSTEMS  Negative except as noted here or in the History of Present Illness.   PHYSICAL EXAMINATION  Initial Vital Signs Blood pressure 136/90, pulse 93, temperature 98.4 F (36.9 C), temperature source Oral, resp. rate 18, height 5'  6" (1.676 m), weight 79.4 kg (175 lb), SpO2 100 %.  Examination General: Well-developed, well-nourished male in no acute distress; appearance consistent with age of record HENT: normocephalic; atraumatic Eyes: pupils equal, round and reactive to light; extraocular muscles intact Neck: supple Heart: regular rate and rhythm Lungs: clear to auscultation bilaterally Abdomen: soft; nondistended; diffusely tender; no masses or hepatosplenomegaly; bowel sounds present Extremities: No deformity; full range of motion; pulses normal Neurologic: Awake, alert and oriented; motor function intact in all extremities and symmetric; no facial droop Skin: Warm and dry Psychiatric: Anxious   RESULTS  Summary of  this visit's results, reviewed by myself:   EKG Interpretation  Date/Time:    Ventricular Rate:    PR Interval:    QRS Duration:   QT Interval:    QTC Calculation:   R Axis:     Text Interpretation:        Laboratory Studies: Results for orders placed or performed during the hospital encounter of 04/19/18 (from the past 24 hour(s))  CBC with Differential/Platelet     Status: Abnormal   Collection Time: 04/19/18  4:24 AM  Result Value Ref Range   WBC 13.0 (H) 4.0 - 10.5 K/uL   RBC 4.43 4.22 - 5.81 MIL/uL   Hemoglobin 12.4 (L) 13.0 - 17.0 g/dL   HCT 16.1 (L) 09.6 - 04.5 %   MCV 81.5 78.0 - 100.0 fL   MCH 28.0 26.0 - 34.0 pg   MCHC 34.3 30.0 - 36.0 g/dL   RDW 40.9 (H) 81.1 - 91.4 %   Platelets 188 150 - 400 K/uL   Neutrophils Relative % 70 %   Lymphocytes Relative 21 %   Monocytes Relative 7 %   Eosinophils Relative 1 %   Basophils Relative 1 %   Neutro Abs 9.2 (H) 1.7 - 7.7 K/uL   Lymphs Abs 2.7 0.7 - 4.0 K/uL   Monocytes Absolute 0.9 0.1 - 1.0 K/uL   Eosinophils Absolute 0.1 0.0 - 0.7 K/uL   Basophils Absolute 0.1 0.0 - 0.1 K/uL   RBC Morphology Spherocytes present    WBC Morphology WHITE COUNT CONFIRMED ON SMEAR    Smear Review PLATELET COUNT CONFIRMED BY SMEAR   Comprehensive metabolic panel     Status: Abnormal   Collection Time: 04/19/18  4:24 AM  Result Value Ref Range   Sodium 139 135 - 145 mmol/L   Potassium 3.6 3.5 - 5.1 mmol/L   Chloride 105 98 - 111 mmol/L   CO2 27 22 - 32 mmol/L   Glucose, Bld 122 (H) 70 - 99 mg/dL   BUN 13 6 - 20 mg/dL   Creatinine, Ser 7.82 0.61 - 1.24 mg/dL   Calcium 9.5 8.9 - 95.6 mg/dL   Total Protein 8.1 6.5 - 8.1 g/dL   Albumin 5.1 (H) 3.5 - 5.0 g/dL   AST 27 15 - 41 U/L   ALT 29 0 - 44 U/L   Alkaline Phosphatase 78 38 - 126 U/L   Total Bilirubin 2.2 (H) 0.3 - 1.2 mg/dL   GFR calc non Af Amer >60 >60 mL/min   GFR calc Af Amer >60 >60 mL/min   Anion gap 7 5 - 15  Lipase, blood     Status: None   Collection Time: 04/19/18   4:24 AM  Result Value Ref Range   Lipase 41 11 - 51 U/L  Urinalysis, Routine w reflex microscopic     Status: Abnormal   Collection Time: 04/19/18  4:24 AM  Result Value Ref Range   Color, Urine YELLOW YELLOW   APPearance CLEAR CLEAR   Specific Gravity, Urine >1.030 (H) 1.005 - 1.030   pH 5.0 5.0 - 8.0   Glucose, UA NEGATIVE NEGATIVE mg/dL   Hgb urine dipstick NEGATIVE NEGATIVE   Bilirubin Urine SMALL (A) NEGATIVE   Ketones, ur NEGATIVE NEGATIVE mg/dL   Protein, ur NEGATIVE NEGATIVE mg/dL   Nitrite NEGATIVE NEGATIVE   Leukocytes, UA NEGATIVE NEGATIVE   Imaging Studies: Ct Abdomen Pelvis W Contrast  Result Date: 04/19/2018 CLINICAL DATA:  Onset of abdominal pain with vomiting and diarrhea 3 a.m. last night. EXAM: CT ABDOMEN AND PELVIS WITH CONTRAST TECHNIQUE: Multidetector CT imaging of the abdomen and pelvis was performed using the standard protocol following bolus administration of intravenous contrast. CONTRAST:  ISOVUE-300 IOPAMIDOL (ISOVUE-300) INJECTION 61% COMPARISON:  Plain films chest and abdomen this same day. CT abdomen and pelvis 03/07/2018. FINDINGS: Lower chest: Lung bases are clear. No pleural or pericardial effusion. Hepatobiliary: Small cyst in the right hepatic lobe is unchanged. The liver is somewhat low attenuating relative to the spleen. Gallbladder and biliary tree are unremarkable. Pancreas: Unremarkable. No pancreatic ductal dilatation or surrounding inflammatory changes. Spleen: Splenomegaly is unchanged.  No focal lesion. Adrenals/Urinary Tract: Adrenal glands are unremarkable. Kidneys are normal, without renal calculi, focal lesion, or hydronephrosis. Bladder is unremarkable. Stomach/Bowel: The appendix has been removed. The stomach is unremarkable. There is no small bowel dilatation or transition point. Short air-fluid levels and liquid material throughout the small and large bowel are identified. No pneumatosis, portal venous gas or free intraperitoneal air is  seen. Vascular/Lymphatic: No significant vascular findings are present. No enlarged abdominal or pelvic lymph nodes. Reproductive: Prostate is unremarkable. Other: No fluid collection. Musculoskeletal: Small bone island in the right femoral head is noted. Otherwise negative. IMPRESSION: Negative for bowel obstruction. Short air-fluid levels in small bowel may be due to enteritis. Liquid attenuation material throughout the colon is consistent with the patient's history of diarrhea. Unchanged splenomegaly. Mild fatty infiltration of the liver. Electronically Signed   By: Drusilla Kanner M.D.   On: 04/19/2018 07:49   Dg Abdomen Acute W/chest  Result Date: 04/19/2018 CLINICAL DATA:  31 year old male with abdominal pain and vomiting. EXAM: DG ABDOMEN ACUTE W/ 1V CHEST COMPARISON:  CT of the abdomen pelvis dated 03/07/2018 FINDINGS: The lungs are clear. There is no pleural effusion or pneumothorax. The cardiac silhouette is within normal limits. Mildly dilated small bowel loops in the mid abdomen measure up to 3.3 cm in diameter. Air is noted within the colon. No free air or radiopaque calculi. The spleen appears enlarged measuring approximately 20 cm in craniocaudal length. Mild dextroscoliosis centered at L3-L4. No acute osseous pathology. IMPRESSION: Mildly dilated small bowel loops in the central abdomen may represent and ileus or less likely an early/low grade obstruction. Enteritis is not excluded. Clinical correlation is recommended. Electronically Signed   By: Elgie Collard M.D.   On: 04/19/2018 06:28    ED COURSE and MDM  Nursing notes and initial vitals signs, including pulse oximetry, reviewed.  Vitals:   04/19/18 2255 04/19/18 2256 04/20/18 0008 04/20/18 0115  BP:  136/90 130/77 126/81  Pulse:  93 87 84  Resp:  18 16   Temp:  98.4 F (36.9 C)    TempSrc:  Oral    SpO2:  100% 99% 98%  Weight: 79.4 kg (175 lb)     Height: 5\' 6"  (1.676 m)      1:15  AM Patient feeling better after IV  fluids and medications.  We will switch him from Zofran to Phenergan.  PROCEDURES    ED DIAGNOSES     ICD-10-CM   1. Gastroenteritis K52.9        Wallace Gappa, MD 04/20/18 [phone]

## 2018-04-19 NOTE — Discharge Instructions (Addendum)
Follow up with Dr. Chales AbrahamsGupta. Return to the ED if you develop new or worsening symptoms.   Imodium for diarrhea.

## 2018-04-19 NOTE — ED Provider Notes (Signed)
I received the patient in signout from Dr. Manus Gunningancour, briefly patient is a 32 year old male with nausea vomiting and diarrhea after eating leftovers.  Patient thinks that that is the cause.  Having some crampy abdominal pain.  A plain film was obtained that showed a possible small bowel obstruction and so a CT scan was ordered.  Plan is to await CT.  CT scan with findings of enteritis, no bowel obstruction.  Will treat symptomatically.  PCP follow-up.   Melene PlanFloyd, Judy Goodenow, DO 04/19/18 [phone]

## 2018-04-19 NOTE — ED Notes (Signed)
Pt given po contrast to drink for CT scan

## 2018-04-19 NOTE — ED Notes (Signed)
Warm blanket given

## 2018-04-19 NOTE — ED Notes (Signed)
Patient transported to X-ray 

## 2018-04-20 MED ORDER — PROMETHAZINE HCL 25 MG PO TABS: 25.0000 mg | ORAL_TABLET | Freq: Four times a day (QID) | ORAL | 0 refills | Status: DC | PRN

## 2018-04-29 ENCOUNTER — Encounter: Payer: Self-pay | Admitting: Gastroenterology

## 2018-04-29 ENCOUNTER — Ambulatory Visit: Payer: Self-pay | Admitting: Hematology & Oncology

## 2018-04-29 ENCOUNTER — Telehealth: Payer: Self-pay | Admitting: Gastroenterology

## 2018-04-29 ENCOUNTER — Ambulatory Visit (INDEPENDENT_AMBULATORY_CARE_PROVIDER_SITE_OTHER): Payer: PRIVATE HEALTH INSURANCE | Admitting: Gastroenterology

## 2018-04-29 ENCOUNTER — Other Ambulatory Visit: Payer: Self-pay

## 2018-04-29 VITALS — BP 116/72 | HR 76 | Ht 66.0 in | Wt 174.0 lb

## 2018-04-29 DIAGNOSIS — B9681 Helicobacter pylori [H. pylori] as the cause of diseases classified elsewhere: Secondary | ICD-10-CM

## 2018-04-29 DIAGNOSIS — K219 Gastro-esophageal reflux disease without esophagitis: Secondary | ICD-10-CM | POA: Diagnosis not present

## 2018-04-29 DIAGNOSIS — K2970 Gastritis, unspecified, without bleeding: Secondary | ICD-10-CM

## 2018-04-29 MED ORDER — DEXLANSOPRAZOLE 60 MG PO CPDR
60.0000 mg | DELAYED_RELEASE_CAPSULE | ORAL | 1 refills | Status: AC
Start: 2018-04-29 — End: ?

## 2018-04-29 NOTE — Telephone Encounter (Signed)
Opened by mistake.

## 2018-04-29 NOTE — Progress Notes (Signed)
IMPRESSION and PLAN:    #1.  H. pylori gastritis 03/2018 #2.  Gastroesophageal reflux -Check stool for H. pylori antigen -Decrease Dexilant to 60 mg every other day.  If he does well, he can use it on as needed basis. -Follow-up in 6 months, earlier in case of any problems. -Would recommend to hold off on cholecystectomy as it may cause more problems. (He had low ejection fraction previously but no cholelithiasis).  We can certainly do another ultrasound in about a year.      HPI:    Chief Complaint:   Clarence Hines is a 32 y.o. male  Has been doing very well Underwent EGD which showed H. pylori gastritis.  Treated with triple drug therapy with good results. He has also seen a psychiatrist and is currently on antianxiety medications. Had a bout of acute gastroenteritis after eating at a new [place] restaurant-had nausea/vomiting/diarrhea, seen in the emergency room underwent CT scan which showed acute gastroenteritis.  This has resolved. No significant GI complaints   Past Medical History:  Diagnosis Date  . Anxiety   . Hereditary spherocytosis (HCC)   . Hypertension     Current Outpatient Medications  Medication Sig Dispense Refill  . B Complex-C (B-COMPLEX WITH VITAMIN C) tablet Take 1 tablet by mouth daily.    . busPIRone (BUSPAR) 5 MG tablet Take 5 mg by mouth 2 (two) times daily.    Marland Kitchen dexlansoprazole (DEXILANT) 60 MG capsule Take 1 capsule (60 mg total) by mouth daily. 30 capsule 0  . folic acid (FOLVITE) 1 MG tablet Take 5 mg by mouth daily.    . metoprolol succinate (TOPROL-XL) 25 MG 24 hr tablet Take 25 mg by mouth daily.    . Probiotic Product (PROBIOTIC-10 PO) Take by mouth.    . telmisartan (MICARDIS) 40 MG tablet Take 40 mg by mouth daily.     No current facility-administered medications for this visit.     Past Surgical History:  Procedure Laterality Date  . APPENDECTOMY    . GASTROPLASTY    . SPHINCTEROTOMY      Family History  Problem  Relation Age of Onset  . Hypertension Mother   . Hypertension Father     Social History   Tobacco Use  . Smoking status: Never Smoker  . Smokeless tobacco: Never Used  Substance Use Topics  . Alcohol use: No    Alcohol/week: 0.0 oz  . Drug use: No    Allergies  Allergen Reactions  . Sulfa Antibiotics     unknown     Review of Systems: All systems reviewed and negative except where noted in HPI.    Physical Exam:     BP 116/72   Pulse 76   Ht 5\' 6"  (1.676 m)   Wt 174 lb (78.9 kg)   BMI 28.08 kg/m  @WEIGHTLAST3 @ GENERAL:  Alert, oriented, cooperative, not in acute distress. PSYCH: :Pleasant, normal mood and affect. HEENT:  conjunctiva pink, mucous membranes moist, neck supple without masses. No jaundice. CARDIAC:  S1 S2 normal. No murmers. PULM: Normal respiratory effort, lungs CTA bilaterally, no wheezing. ABDOMEN: Inspection: No visible peristalsis, no abnormal pulsations, skin normal.  Palpation/percussion: Soft, nontender, nondistended, no rigidity, no abnormal dullness to percussion, no hepato megaly.  Spleen is palpated 2 cm below the left costal margin.  And no palpable abdominal masses.  Auscultation: Normal bowel sounds, no abdominal bruits. Rectal exam: Deferred SKIN:  turgor, no lesions seen. Musculoskeletal:  Normal muscle tone,  normal strength. NEURO: Alert and oriented x 3, no focal neurologic deficits.   Radiology Studies: Ct Abdomen Pelvis W Contrast  Result Date: 04/19/2018 CLINICAL DATA:  Onset of abdominal pain with vomiting and diarrhea 3 a.m. last night. EXAM: CT ABDOMEN AND PELVIS WITH CONTRAST TECHNIQUE: Multidetector CT imaging of the abdomen and pelvis was performed using the standard protocol following bolus administration of intravenous contrast. CONTRAST:  100mL ISOVUE-300 IOPAMIDOL (ISOVUE-300) INJECTION 61% COMPARISON:  Plain films chest and abdomen this same day. CT abdomen and pelvis 03/07/2018. FINDINGS: Lower chest: Lung bases are  clear. No pleural or pericardial effusion. Hepatobiliary: Small cyst in the right hepatic lobe is unchanged. The liver is somewhat low attenuating relative to the spleen. Gallbladder and biliary tree are unremarkable. Pancreas: Unremarkable. No pancreatic ductal dilatation or surrounding inflammatory changes. Spleen: Splenomegaly is unchanged.  No focal lesion. Adrenals/Urinary Tract: Adrenal glands are unremarkable. Kidneys are normal, without renal calculi, focal lesion, or hydronephrosis. Bladder is unremarkable. Stomach/Bowel: The appendix has been removed. The stomach is unremarkable. There is no small bowel dilatation or transition point. Short air-fluid levels and liquid material throughout the small and large bowel are identified. No pneumatosis, portal venous gas or free intraperitoneal air is seen. Vascular/Lymphatic: No significant vascular findings are present. No enlarged abdominal or pelvic lymph nodes. Reproductive: Prostate is unremarkable. Other: No fluid collection. Musculoskeletal: Small bone island in the right femoral head is noted. Otherwise negative. IMPRESSION: Negative for bowel obstruction. Short air-fluid levels in small bowel may be due to enteritis. Liquid attenuation material throughout the colon is consistent with the patient's history of diarrhea. Unchanged splenomegaly. Mild fatty infiltration of the liver. Electronically Signed   By: Drusilla Kannerhomas  Dalessio M.D.   On: 04/19/2018 07:49   Dg Abdomen Acute W/chest  Result Date: 04/19/2018 CLINICAL DATA:  32 year old male with abdominal pain and vomiting. EXAM: DG ABDOMEN ACUTE W/ 1V CHEST COMPARISON:  CT of the abdomen pelvis dated 03/07/2018 FINDINGS: The lungs are clear. There is no pleural effusion or pneumothorax. The cardiac silhouette is within normal limits. Mildly dilated small bowel loops in the mid abdomen measure up to 3.3 cm in diameter. Air is noted within the colon. No free air or radiopaque calculi. The spleen appears  enlarged measuring approximately 20 cm in craniocaudal length. Mild dextroscoliosis centered at L3-L4. No acute osseous pathology. IMPRESSION: Mildly dilated small bowel loops in the central abdomen may represent and ileus or less likely an early/low grade obstruction. Enteritis is not excluded. Clinical correlation is recommended. Electronically Signed   By: Elgie CollardArash  Radparvar M.D.   On: 04/19/2018 06:28      Aailyah Dunbar,MD 04/29/2018, 9:18 AM   CC No ref. provider found

## 2018-04-29 NOTE — Patient Instructions (Signed)
If you are age 34 or older, your body mass index should be between 23-30. Your Body mass index is 28.08 kg/m. If this is out of the aforementioned range listed, please consider follow up with your Primary Care Provider.  If you are age 34 or younger, your body mass index should be between 19-25. Your Body mass index is 28.08 kg/m. If this is out of the aformentioned range listed, please consider follow up with your Primary Care Provider.   We have sent the following medications to your pharmacy for you to pick up at your convenience: Dexilant 60mg  decrease to one capsule by mouth every other day.  Please stop at the lab before you leave the office today.   Thank you,  Dr. Lynann Bolognaajesh Gupta

## 2018-05-02 ENCOUNTER — Other Ambulatory Visit (INDEPENDENT_AMBULATORY_CARE_PROVIDER_SITE_OTHER): Payer: PRIVATE HEALTH INSURANCE

## 2018-05-02 DIAGNOSIS — K2970 Gastritis, unspecified, without bleeding: Secondary | ICD-10-CM | POA: Diagnosis not present

## 2018-05-02 DIAGNOSIS — B9681 Helicobacter pylori [H. pylori] as the cause of diseases classified elsewhere: Secondary | ICD-10-CM | POA: Diagnosis not present

## 2018-05-02 DIAGNOSIS — K219 Gastro-esophageal reflux disease without esophagitis: Secondary | ICD-10-CM

## 2018-05-03 LAB — HELICOBACTER PYLORI  SPECIAL ANTIGEN
MICRO NUMBER: 90893092
SPECIMEN QUALITY: ADEQUATE

## 2018-05-31 IMAGING — US US ABDOMEN COMPLETE
1 series · 13 of 25 positions shown · non-contrast
Comparison: Abdominal ultrasound 05/21/2017.

CLINICAL DATA: Patient with right upper quadrant abdominal pain.
History of splenomegaly.

EXAM:
ABDOMEN ULTRASOUND COMPLETE

[Series 1: us abdomen complete · 0.17mm/px · 13 of 79 slices shown]
[im 1/79]
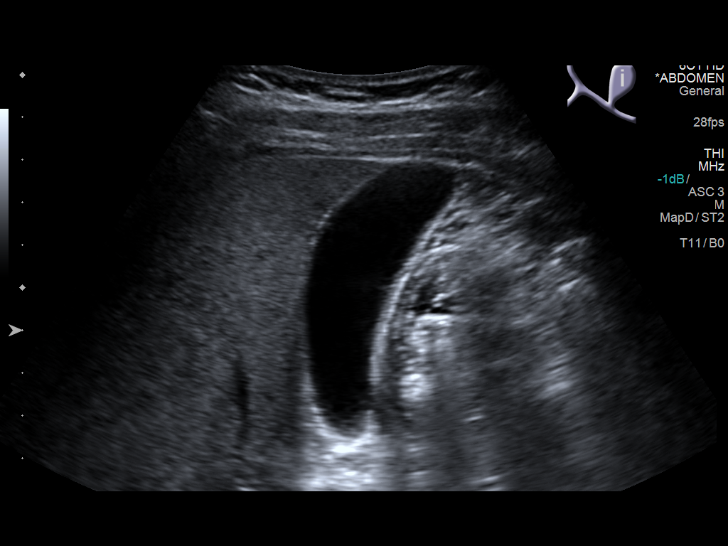
[im 7/79]
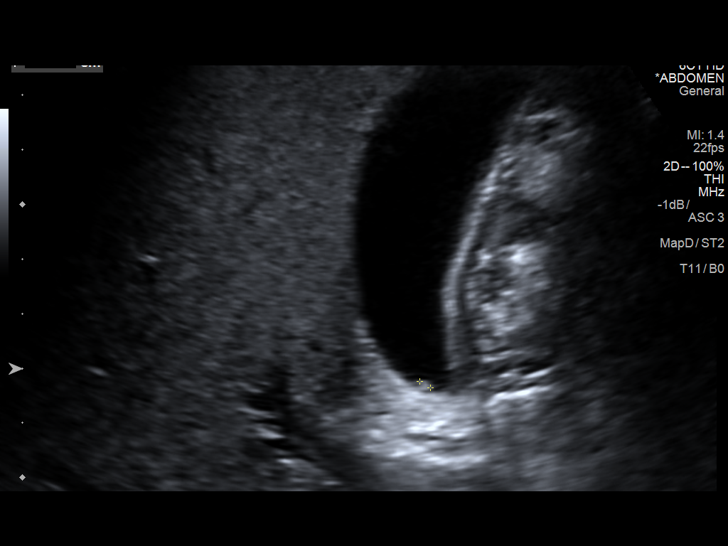
[im 14/79]
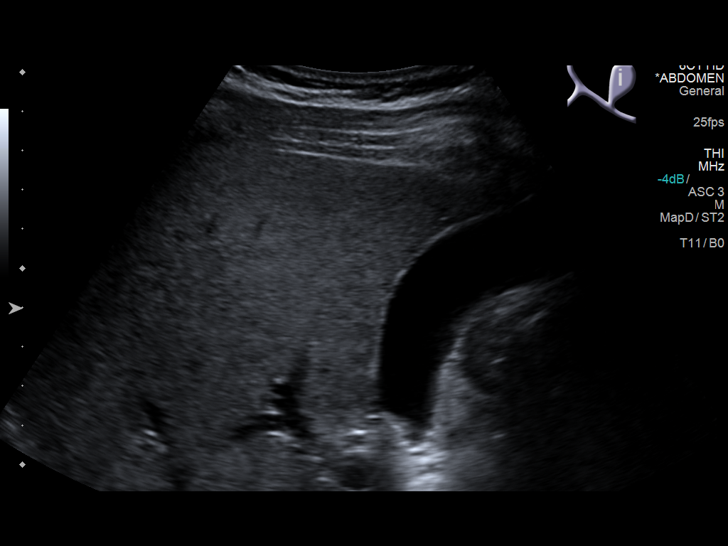
[im 20/79]
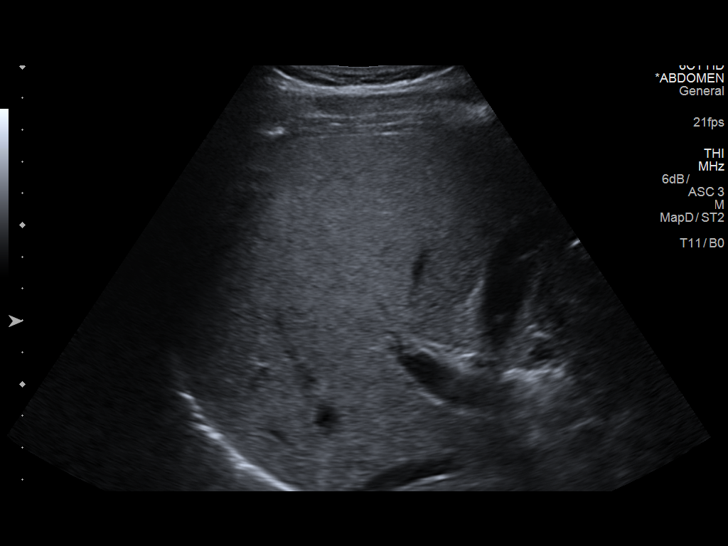
[im 27/79]
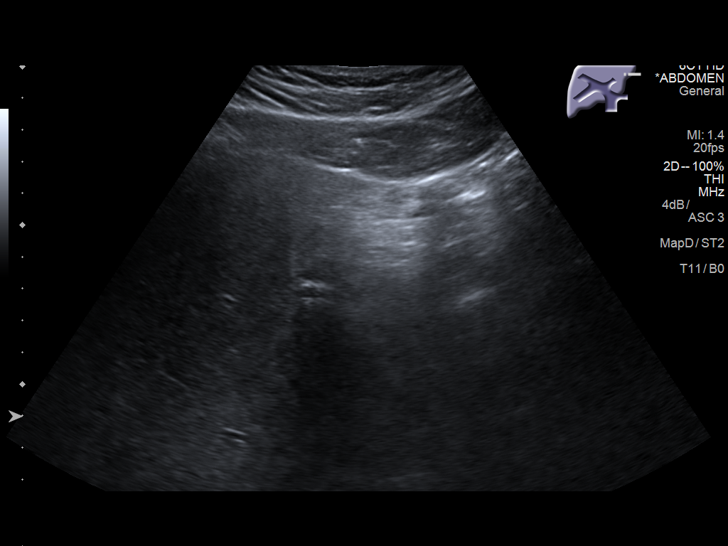
[im 33/79]
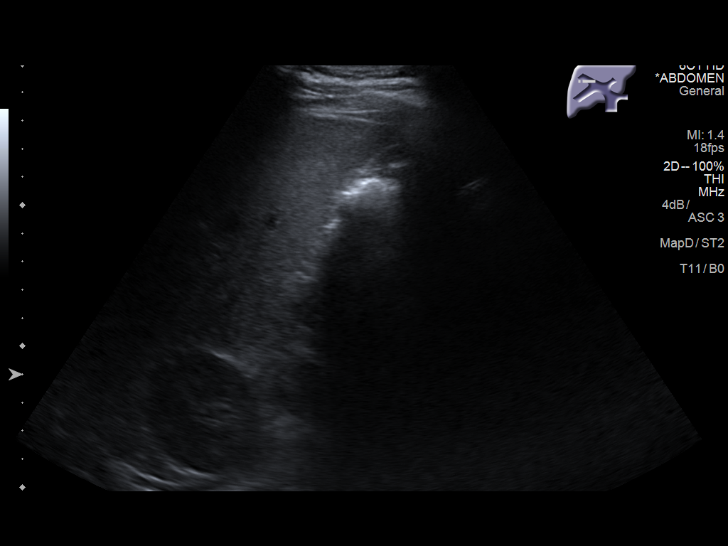
[im 40/79]
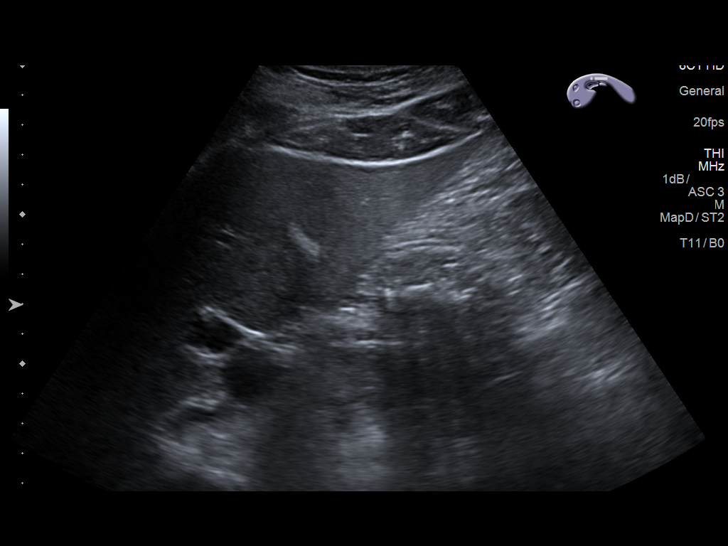
[im 46/79]
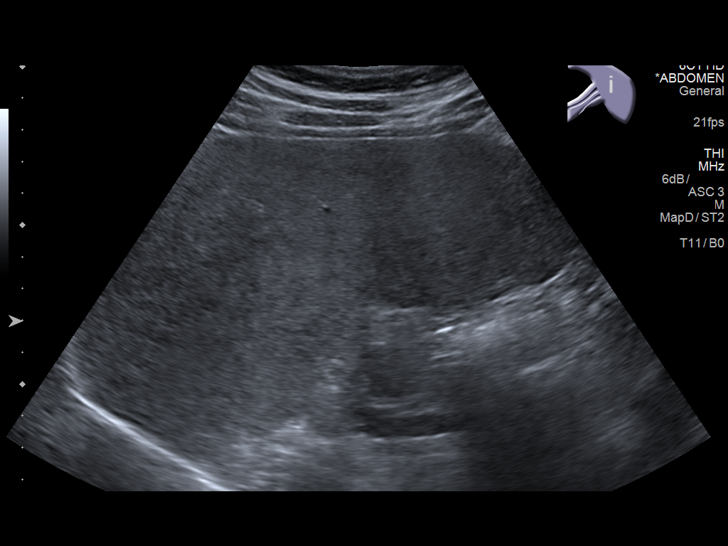
[im 53/79]
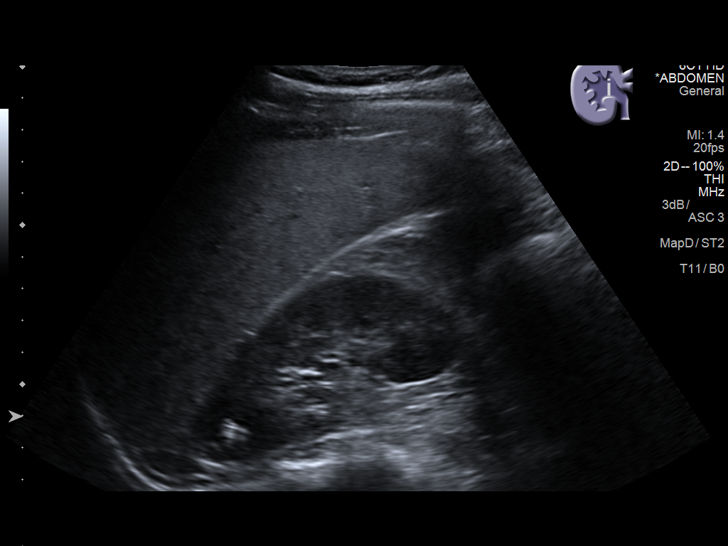
[im 59/79]
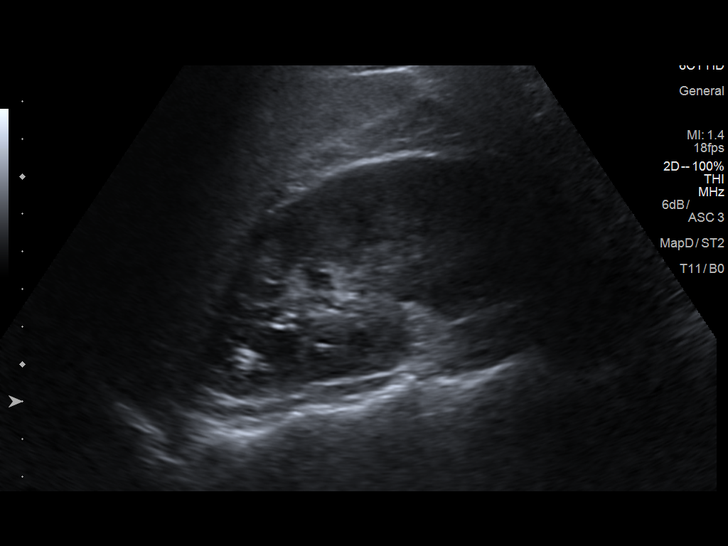
[im 66/79]
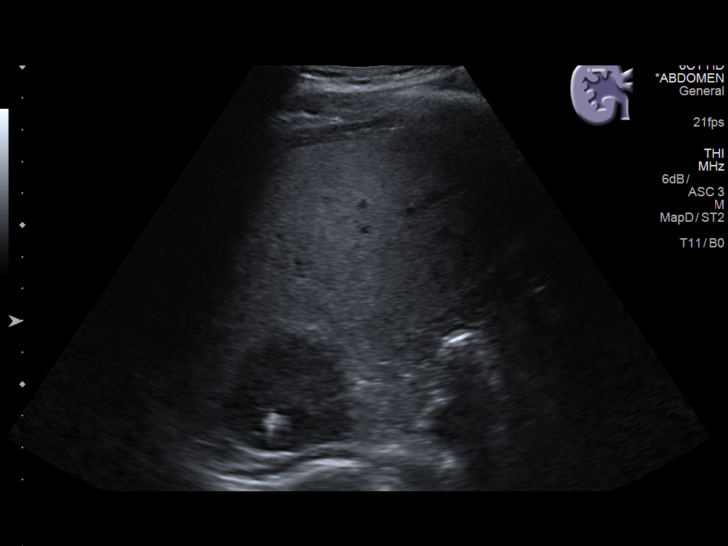
[im 72/79]
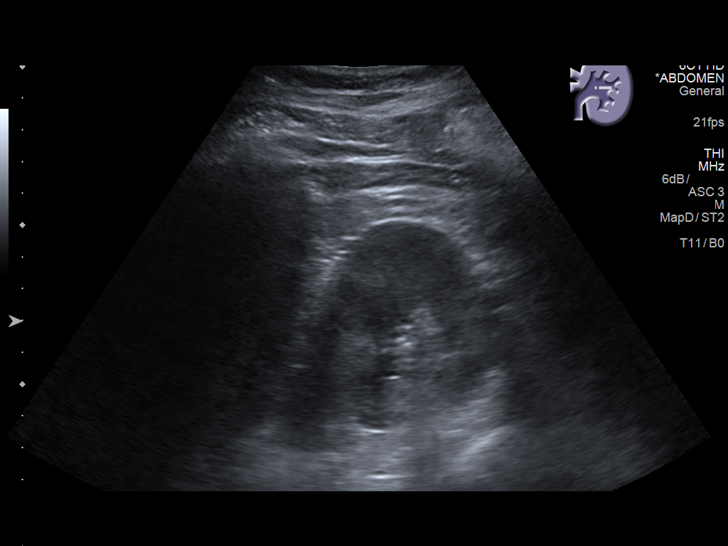
[im 79/79]
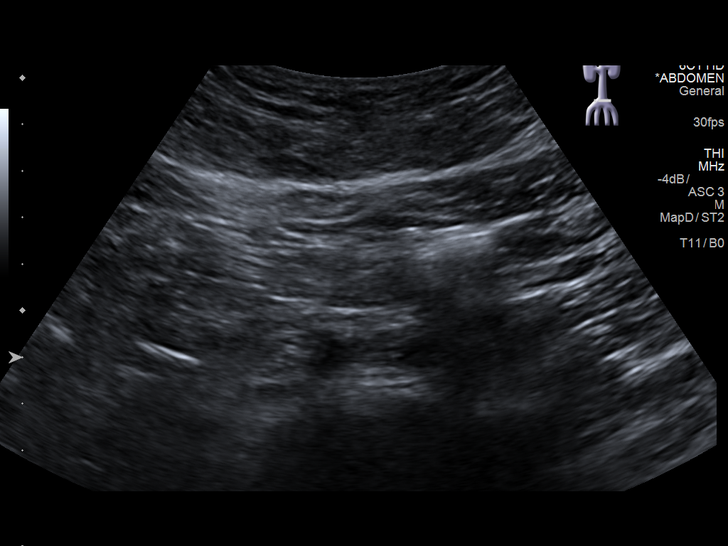

[13 of 25 positions shown; findings below may reference images not displayed]

FINDINGS: Gallbladder: No gallbladder wall thickening or pericholecystic
fluid. Negative sonographic Murphy's sign. 3 mm polyp within the
gallbladder lumen.

Common bile duct: Diameter: 3 mm

Liver: Increase in echogenicity. No focal lesion identified. Portal
vein is patent on color Doppler imaging with normal direction of
blood flow towards the liver.

IVC: No abnormality visualized.

Pancreas: Visualized portion unremarkable.

Spleen: 14.9 cm.

Right Kidney: Length: 10.4 cm. Normal renal cortical thickness and
echogenicity. There is a subcentimeter echogenic lesion within the
superior pole which may represent a small angiomyolipoma.

Left Kidney: Length: 11.1 cm. Echogenicity within normal limits. No
mass or hydronephrosis visualized.

Abdominal aorta: No aneurysm visualized.

Other findings: None.
IMPRESSION: Hepatic steatosis.

Splenomegaly.

Similar-appearing echogenic lesion superior pole right kidney,
favored to represent an angiomyolipoma. Consider follow-up
ultrasound in 12 months to ensure stability.

## 2018-08-01 ENCOUNTER — Ambulatory Visit (INDEPENDENT_AMBULATORY_CARE_PROVIDER_SITE_OTHER): Payer: PRIVATE HEALTH INSURANCE | Admitting: Gastroenterology

## 2018-08-01 ENCOUNTER — Encounter: Payer: Self-pay | Admitting: Gastroenterology

## 2018-08-01 VITALS — BP 142/92 | HR 92 | Ht 66.0 in | Wt 169.2 lb

## 2018-08-01 DIAGNOSIS — R197 Diarrhea, unspecified: Secondary | ICD-10-CM

## 2018-08-01 MED ORDER — HYOSCYAMINE SULFATE 0.125 MG SL SUBL: 0.1250 mg | SUBLINGUAL_TABLET | Freq: Four times a day (QID) | SUBLINGUAL | 0 refills | Status: DC | PRN

## 2018-08-01 NOTE — Progress Notes (Signed)
IMPRESSION and PLAN:    #1. Diarrhea -likely IBS.  Rule out other causes. #2. H. pylori gastritis 03/2018 #3. Gastroesophageal reflux #4. Wt loss (neg CT abdo/pelvis 03/2018, 04/2018, [place] 02/2018, neg EGD 03/2018) #5. 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 Myna Hidalgo  Plan: - Check GI pathogen, H. pylori antigen and giardia antigen. - Proceed with colonoscopy for further evaluation.  Discussed risks and benefits with the patient and patient's sister.  Patient's sister is a Teacher, early years/pre at [employer] in [place]. - Bentyl 10 mg p.o. Nightly.  Over the weekend he can increase it to twice daily. - hyoscamine 0.125 mg 1 to 2 tablets sublingual every 6-8 hours as needed for abdominal pain/diarrhea. - Decrease Dexilant to 60 mg every other day.  If he does well, he can use it on as needed basis. - Follow-up after the above. - Would recommend to hold off on cholecystectomy as it may cause more problems. (He had low ejection fraction previously but no cholelithiasis).  We can certainly do another ultrasound in about a year.  - He will also call his psychologist regarding anxiety.  Continue BuSpar for now.  HPI:    Chief Complaint:   Clarence Hines is a 32 y.o. male  Again having problems x 1 week Has been doing well previously Does admit that his anxiety which has been under good control with BuSpar has lately been uncontrolled. Took dicyclomine 10 mg at bedtime with some relief. Has lost approximately 10 pounds over the last 2 months.  He has not been trying to lose weight. Accompanied by his sister  He did eat at [facility] a week ago and started having increasing symptoms with diarrhea.  Has been having right upper quadrant abdominal pain which does get better with defecation.  No melena or hematochezia. Dicyclomine 10mg    Has been doing very well  Had back/neck pain-extensive work-up including MRI was negative.   Past Medical History:  Diagnosis Date  .  Anxiety   . Hereditary spherocytosis (HCC)   . Hypertension     Current Outpatient Medications  Medication Sig Dispense Refill  . busPIRone (BUSPAR) 5 MG tablet Take 5 mg by mouth 2 (two) times daily.    Marland Kitchen dexlansoprazole (DEXILANT) 60 MG capsule Take 1 capsule (60 mg total) by mouth every other day. 90 capsule 1  . folic acid (FOLVITE) 1 MG tablet Take 5 mg by mouth daily.    . metoprolol succinate (TOPROL-XL) 25 MG 24 hr tablet Take 25 mg by mouth daily.    . multivitamin (ONE-A-DAY MEN'S) TABS tablet Take 1 tablet by mouth daily.    Marland Kitchen telmisartan (MICARDIS) 40 MG tablet Take 40 mg by mouth daily.     No current facility-administered medications for this visit.     Past Surgical History:  Procedure Laterality Date  . APPENDECTOMY    . GASTROPLASTY    . SPHINCTEROTOMY      Family History  Problem Relation Age of Onset  . Hypertension Mother   . Hypertension Father     Social History   Tobacco Use  . Smoking status: Never Smoker  . Smokeless tobacco: Never Used  Substance Use Topics  . Alcohol use: No    Alcohol/week: 0.0 standard drinks  . Drug use: No    Allergies  Allergen Reactions  . Sulfa Antibiotics     unknown     Review of Systems: All systems reviewed and negative except where noted in  HPI.    Physical Exam:     BP (!) 142/92   Pulse 92   Ht 5\' 6"  (1.676 m)   Wt 169 lb 4 oz (76.8 kg)   BMI 27.32 kg/m  Filed Weights   08/01/18 1602  Weight: 169 lb 4 oz (76.8 kg)  weight 175lb on 02/2018  GENERAL:  Alert, oriented, cooperative, not in acute distress. PSYCH: :Pleasant, normal mood and affect. HEENT:  conjunctiva pink, mucous membranes moist, neck supple without masses. No jaundice. CARDIAC:  S1 S2 normal. No murmers. PULM: Normal respiratory effort, lungs CTA bilaterally, no wheezing. ABDOMEN: Inspection: No visible peristalsis, no abnormal pulsations, skin normal.  Palpation/percussion: Soft, nontender, nondistended, no rigidity, no  abnormal dullness to percussion, no hepato megaly.  Spleen is palpated 2 cm below the left costal margin.  And no palpable abdominal masses.  Auscultation: Normal bowel sounds, no abdominal bruits. Rectal exam: Deferred SKIN:  turgor, no lesions seen. Musculoskeletal:  Normal muscle tone, normal strength. NEURO: Alert and oriented x 3, no focal neurologic deficits.  CBC Latest Ref Rng & Units 04/19/2018 02/20/2018 02/16/2018  WBC 4.0 - 10.5 K/uL 13.0(H) 9.9 10.2(H)  Hemoglobin 13.0 - 17.0 g/dL 12.4(L) 11.7(L) 11.8(L)  Hematocrit 39.0 - 52.0 % 36.1(L) 33.5(L) 34.6(L)  Platelets 150 - 400 K/uL 188 164 166   CMP Latest Ref Rng & Units 04/19/2018 02/20/2018 02/16/2018  Glucose 70 - 99 mg/dL 161(W) 960(A) 540  BUN 6 - 20 mg/dL 13 8 9   Creatinine 0.61 - 1.24 mg/dL 9.81 1.91 4.78  Sodium 135 - 145 mmol/L 139 140 143  Potassium 3.5 - 5.1 mmol/L 3.6 3.9 3.7  Chloride 98 - 111 mmol/L 105 109 110(H)  CO2 22 - 32 mmol/L 27 24 28   Calcium 8.9 - 10.3 mg/dL 9.5 9.3 9.7  Total Protein 6.5 - 8.1 g/dL 8.1 7.2 7.3  Total Bilirubin 0.3 - 1.2 mg/dL 2.2(H) 1.8(H) 2.4(H)  Alkaline Phos 38 - 126 U/L 78 64 80  AST 15 - 41 U/L 27 24 27   ALT 0 - 44 U/L 29 23 37  25 minutes spent with the patient today. Greater than 50% was spent in counseling and coordination of care with the patient      Staceyann Knouff,MD 08/01/2018, 4:17 PM   CC Judd Lien, PA-C

## 2018-08-01 NOTE — Patient Instructions (Signed)
If you are age 34 or older, your body mass index should be between 23-30. Your Body mass index is 27.32 kg/m. If this is out of the aforementioned range listed, please consider follow up with your Primary Care Provider.  If you are age 72 or younger, your body mass index should be between 19-25. Your Body mass index is 27.32 kg/m. If this is out of the aformentioned range listed, please consider follow up with your Primary Care Provider.   You have been scheduled for a colonoscopy. Please follow written instructions given to you at your visit today.  Please pick up your prep supplies at the pharmacy within the next 1-3 days. If you use inhalers (even only as needed), please bring them with you on the day of your procedure. Your physician has requested that you go to www.startemmi.com and enter the access code given to you at your visit today. This web site gives a general overview about your procedure. However, you should still follow specific instructions given to you by our office regarding your preparation for the procedure.  Please bring your stool samples back to the lab on the 2nd floor [address].    Thank you,  Dr. Lynann Bologna

## 2018-08-02 ENCOUNTER — Other Ambulatory Visit (INDEPENDENT_AMBULATORY_CARE_PROVIDER_SITE_OTHER): Payer: PRIVATE HEALTH INSURANCE

## 2018-08-02 ENCOUNTER — Other Ambulatory Visit: Payer: Self-pay | Admitting: *Deleted

## 2018-08-02 ENCOUNTER — Telehealth: Payer: Self-pay | Admitting: *Deleted

## 2018-08-02 DIAGNOSIS — R197 Diarrhea, unspecified: Secondary | ICD-10-CM

## 2018-08-02 MED ORDER — FOLIC ACID 1 MG PO TABS: 5.0000 mg | ORAL_TABLET | Freq: Every day | ORAL | 2 refills | Status: DC

## 2018-08-02 NOTE — Telephone Encounter (Signed)
Message received from patient requesting a prescription for Folic Acid 5 mg daily.  He states that he usually receives the 5mg  dose from his father in [place], but will not be able to get it at this time and would like a prescription sent to [facility] on [address].  Dr. Myna Hidalgo notified and order received to send prescription for Folic Acid 5 mg daily.  Call placed back to patient and message left to notify patient that prescription would be sent as requested.

## 2018-08-03 ENCOUNTER — Telehealth: Payer: Self-pay | Admitting: Gastroenterology

## 2018-08-03 NOTE — Telephone Encounter (Signed)
Patient states he is starting to have lower abd pain and wants to know if he should be concerned or what he could do until his procedure on 12.17.19.

## 2018-08-03 NOTE — Telephone Encounter (Signed)
Just continue Bentyl qhs for now If still with problems in 2-3 days, increase Bentyl to 10 mg p.o. twice daily

## 2018-08-04 NOTE — Telephone Encounter (Signed)
Advised 

## 2018-08-08 ENCOUNTER — Telehealth: Payer: Self-pay | Admitting: Gastroenterology

## 2018-08-08 DIAGNOSIS — R197 Diarrhea, unspecified: Secondary | ICD-10-CM

## 2018-08-08 NOTE — Telephone Encounter (Signed)
-----   Message from Chrystie Nose, RN sent at 08/08/2018 12:17 PM EST ----- Regarding: Cancelled lab [facility] can you please follow-up on Giardia lab be cancelled?  It was done at the lab there is [place].

## 2018-08-08 NOTE — Telephone Encounter (Signed)
Patient will come by the office and pick up a container to collect this specimen in.

## 2018-08-08 NOTE — Telephone Encounter (Signed)
Pt calling for lab results, please advise. 

## 2018-08-09 ENCOUNTER — Inpatient Hospital Stay: Payer: PRIVATE HEALTH INSURANCE | Attending: Hematology & Oncology

## 2018-08-09 ENCOUNTER — Other Ambulatory Visit (INDEPENDENT_AMBULATORY_CARE_PROVIDER_SITE_OTHER): Payer: PRIVATE HEALTH INSURANCE

## 2018-08-09 DIAGNOSIS — R197 Diarrhea, unspecified: Secondary | ICD-10-CM

## 2018-08-09 DIAGNOSIS — Z79899 Other long term (current) drug therapy: Secondary | ICD-10-CM | POA: Diagnosis not present

## 2018-08-09 DIAGNOSIS — D580 Hereditary spherocytosis: Secondary | ICD-10-CM

## 2018-08-09 DIAGNOSIS — R1011 Right upper quadrant pain: Secondary | ICD-10-CM

## 2018-08-09 DIAGNOSIS — R109 Unspecified abdominal pain: Secondary | ICD-10-CM | POA: Diagnosis not present

## 2018-08-09 LAB — CMP (CANCER CENTER ONLY)
ALBUMIN: 4.6 g/dL (ref 3.5–5.0)
ALK PHOS: 86 U/L (ref 38–126)
ALT: 18 U/L (ref 0–44)
ANION GAP: 10 (ref 5–15)
AST: 20 U/L (ref 15–41)
BILIRUBIN TOTAL: 1.6 mg/dL — AB (ref 0.3–1.2)
BUN: 10 mg/dL (ref 6–20)
CO2: 23 mmol/L (ref 22–32)
Calcium: 9.7 mg/dL (ref 8.9–10.3)
Chloride: 107 mmol/L (ref 98–111)
Creatinine: 0.8 mg/dL (ref 0.61–1.24)
GFR, Est AFR Am: >60 mL/min (ref >60)
GFR, Estimated: >60 mL/min (ref >60)
GLUCOSE: 122 mg/dL — AB (ref 70–99)
POTASSIUM: 3.9 mmol/L (ref 3.5–5.1)
Sodium: 140 mmol/L (ref 135–145)
Total Protein: 7.4 g/dL (ref 6.5–8.1)

## 2018-08-09 LAB — CBC WITH DIFFERENTIAL (CANCER CENTER ONLY)
Abs Immature Granulocytes: 0.11 10*3/uL — ABNORMAL HIGH (ref 0.00–0.07)
BASOS ABS: 0.1 10*3/uL (ref 0.0–0.1)
Basophils Relative: 1 %
EOS PCT: 3 %
Eosinophils Absolute: 0.3 10*3/uL (ref 0.0–0.5)
HEMATOCRIT: 34 % — AB (ref 39.0–52.0)
HEMOGLOBIN: 11.2 g/dL — AB (ref 13.0–17.0)
IMMATURE GRANULOCYTES: 1 %
LYMPHS ABS: 2.5 10*3/uL (ref 0.7–4.0)
Lymphocytes Relative: 25 %
MCH: 27.3 pg (ref 26.0–34.0)
MCHC: 32.9 g/dL (ref 30.0–36.0)
MCV: 82.7 fL (ref 80.0–100.0)
Monocytes Absolute: 0.7 10*3/uL (ref 0.1–1.0)
Monocytes Relative: 7 %
Neutro Abs: 6.6 10*3/uL (ref 1.7–7.7)
Neutrophils Relative %: 63 %
Platelet Count: 151 10*3/uL (ref 150–400)
RBC: 4.11 MIL/uL — ABNORMAL LOW (ref 4.22–5.81)
RDW: 22.6 % — AB (ref 11.5–15.5)
WBC Count: 10.2 10*3/uL (ref 4.0–10.5)
nRBC: 0.4 % — ABNORMAL HIGH (ref 0.0–0.2)

## 2018-08-09 LAB — RETICULOCYTES
Immature Retic Fract: 23.7 % — ABNORMAL HIGH (ref 2.3–15.9)
RBC.: 4.11 MIL/uL — AB (ref 4.22–5.81)
RETIC COUNT ABSOLUTE: 507.2 10*3/uL — AB (ref 19.0–186.0)
Retic Ct Pct: 12.3 % — ABNORMAL HIGH (ref 0.4–3.1)

## 2018-08-09 LAB — LACTATE DEHYDROGENASE: LDH: 219 U/L — ABNORMAL HIGH (ref 98–192)

## 2018-08-10 LAB — GASTROINTESTINAL PATHOGEN PANEL PCR
C. difficile Tox A/B, PCR: NOT DETECTED
CRYPTOSPORIDIUM, PCR: NOT DETECTED
Campylobacter, PCR: NOT DETECTED
E COLI (STEC) STX1/STX2, PCR: NOT DETECTED
E coli (ETEC) LT/ST PCR: NOT DETECTED
E coli 0157, PCR: NOT DETECTED
GIARDIA LAMBLIA, PCR: NOT DETECTED
NOROVIRUS, PCR: NOT DETECTED
Rotavirus A, PCR: NOT DETECTED
Salmonella, PCR: NOT DETECTED
Shigella, PCR: NOT DETECTED

## 2018-08-10 LAB — HELICOBACTER PYLORI  SPECIAL ANTIGEN
MICRO NUMBER: 91300210
SPECIMEN QUALITY: ADEQUATE

## 2018-08-10 LAB — GIARDIA ANTIGEN
MICRO NUMBER:: 91330288
RESULT:: NOT DETECTED
SPECIMEN QUALITY: ADEQUATE

## 2018-08-17 ENCOUNTER — Telehealth: Payer: Self-pay | Admitting: Gastroenterology

## 2018-08-17 NOTE — Telephone Encounter (Signed)
Pt states all his test results have been normal. He states he is still having abdominal pain in the evening. He spoke with an MD in [place] and he suggested Flagyl 400mg  BID. He started taking this 4 days ago and now the pain is gone. He wants to know if he should finish the antibiotic, should he have any other testing done. Please advise.

## 2018-08-17 NOTE — Telephone Encounter (Signed)
Pt would like to know what the next step in his treatment is since all the tests he had done were negative.

## 2018-08-18 ENCOUNTER — Inpatient Hospital Stay (HOSPITAL_BASED_OUTPATIENT_CLINIC_OR_DEPARTMENT_OTHER): Payer: PRIVATE HEALTH INSURANCE | Admitting: Family

## 2018-08-18 ENCOUNTER — Other Ambulatory Visit: Payer: Self-pay

## 2018-08-18 ENCOUNTER — Encounter: Payer: Self-pay | Admitting: Family

## 2018-08-18 DIAGNOSIS — D580 Hereditary spherocytosis: Secondary | ICD-10-CM | POA: Diagnosis not present

## 2018-08-18 DIAGNOSIS — R109 Unspecified abdominal pain: Secondary | ICD-10-CM | POA: Diagnosis not present

## 2018-08-18 NOTE — Telephone Encounter (Signed)
Pt aware.

## 2018-08-18 NOTE — Progress Notes (Signed)
Hematology and Oncology Follow Up Visit  Clarence Hines [id number] 28-Feb-1986 32 y.o. 08/18/2018   Principle Diagnosis:  Hereditary spherocytosis   Current Therapy:   Folic acid 5 mg PO daily   Interim History: Clarence Hines is here today for follow-up. He is doing fairly well but has been having right sided abdominal pain. His gallbladder scan in May showed a low ejection fraction of 18% felt to indicate biliary dyskinesia. He has held off on having this removed but is now reconsidering due to the right sided pain/burning.  Endoscopy in June showed moderate gastritis. He has been taking Flagyl 400 mg BID for the last 5 days. This was sent to him by his father from [place]. He feels that this is helpful but does not plan to take long term. His father also has history of IBS and has used this in the past. He states that his PCP is aware.  He states that he will be having a colonoscopy with DR. Chales Abrahams in December.  No fever, chills, n/v, cough, rash, dizziness, SOB, chest pain, palpitations or changes in bowel or bladder habits.  No swelling, numbness or tingling in his extremities. He has had some joint aches and pain mostly in his knees.  No falls or syncopal episodes.  No lymphadenopathy noted on exam.  He stopped eating meat for a while but has recently restarted. He is hydrating well and has stopped drinking carbonated beverages. His weight is stable.   ECOG Performance Status: 1 - Symptomatic but completely ambulatory  Medications:  Allergies as of 08/18/2018      Reactions   Sulfa Antibiotics    unknown      Medication List        Accurate as of 08/18/18  4:10 PM. Always use your most recent med list.          B COMPLEX VITAMINS PO Take by mouth.   benzonatate 200 MG capsule Commonly known as:  TESSALON Take by mouth.   busPIRone 5 MG tablet Commonly known as:  BUSPAR Take 5 mg by mouth 2 (two) times daily.   CULTURELLE Caps Take by mouth.   dexlansoprazole  60 MG capsule Commonly known as:  DEXILANT Take 1 capsule (60 mg total) by mouth every other day.   folic acid 1 MG tablet Commonly known as:  FOLVITE Take 5 tablets (5 mg total) by mouth daily.   guaiFENesin-codeine 100-10 MG/5ML syrup Commonly known as:  ROBITUSSIN AC Take by mouth.   VIRTUSSIN A/C 100-10 MG/5ML syrup Generic drug:  guaiFENesin-codeine TK 10 ML PO QID PRN   hyoscyamine 0.125 MG SL tablet Commonly known as:  LEVSIN SL Place 1 tablet (0.125 mg total) under the tongue every 6 (six) hours as needed.   metoprolol succinate 25 MG 24 hr tablet Commonly known as:  TOPROL-XL Take 25 mg by mouth daily.   multivitamin Tabs tablet Take 1 tablet by mouth daily.   oseltamivir 75 MG capsule Commonly known as:  TAMIFLU   predniSONE 10 MG (21) Tbpk tablet Commonly known as:  STERAPRED UNI-PAK 21 TAB Complete Dosepak as outlined in the Instructions   telmisartan 40 MG tablet Commonly known as:  MICARDIS Take 40 mg by mouth daily.       Allergies:  Allergies  Allergen Reactions  . Sulfa Antibiotics     unknown    Past Medical History, Surgical history, Social history, and Family History were reviewed and updated.  Review of Systems: All other 10 point review  of systems is negative.   Physical Exam:  vitals were not taken for this visit.   Wt Readings from Last 3 Encounters:  08/01/18 169 lb 4 oz (76.8 kg)  04/29/18 174 lb (78.9 kg)  04/19/18 175 lb (79.4 kg)    Ocular: Sclerae unicteric, pupils equal, round and reactive to light Ear-nose-throat: Oropharynx clear, dentition fair Lymphatic: No cervical, supraclavicular or axillary adenopathy Lungs no rales or rhonchi, good excursion bilaterally Heart regular rate and rhythm, no murmur appreciated Abd soft, nontender, positive bowel sounds, no liver or spleen tip palpated on exam, no fluid wave  MSK no focal spinal tenderness, no joint edema Neuro: non-focal, well-oriented, appropriate  affect Breasts: Deferred   Lab Results  Component Value Date   WBC 10.2 08/09/2018   HGB 11.2 (L) 08/09/2018   HCT 34.0 (L) 08/09/2018   MCV 82.7 08/09/2018   PLT 151 08/09/2018   Lab Results  Component Value Date   FERRITIN 196 02/11/2017   IRON 115 02/11/2017   TIBC 273 02/11/2017   UIBC 158 02/11/2017   IRONPCTSAT 42 02/11/2017   Lab Results  Component Value Date   RETICCTPCT 12.3 (H) 08/09/2018   RBC 4.11 (L) 08/09/2018   RBC 4.11 (L) 08/09/2018   No results found for: KPAFRELGTCHN, LAMBDASER, KAPLAMBRATIO No results found for: IGGSERUM, IGA, IGMSERUM No results found for: Georgann HousekeeperOTALPROTELP, ALBUMINELP, A1GS, A2GS, BETS, BETA2SER, GAMS, MSPIKE, SPEI   Chemistry      Component Value Date/Time   NA 140 08/09/2018 0830   NA 140 02/22/2017 0917   K 3.9 08/09/2018 0830   K 3.7 02/22/2017 0917   CL 107 08/09/2018 0830   CO2 23 08/09/2018 0830   CO2 25 02/22/2017 0917   BUN 10 08/09/2018 0830   BUN 11.2 02/22/2017 0917   CREATININE 0.80 08/09/2018 0830   CREATININE 0.9 02/22/2017 0917      Component Value Date/Time   CALCIUM 9.7 08/09/2018 0830   CALCIUM 9.4 02/22/2017 0917   ALKPHOS 86 08/09/2018 0830   ALKPHOS 84 02/22/2017 0917   AST 20 08/09/2018 0830   AST 27 02/22/2017 0917   ALT 18 08/09/2018 0830   ALT 34 02/22/2017 0917   BILITOT 1.6 (H) 08/09/2018 0830   BILITOT 2.64 (H) 02/22/2017 0917      Impression and Plan: Ms. Clarence BillingsShesham is a very pleasant 32 yo [place] gentleman with hereditary spherocytosis.  He is now having right sided "burning" and gall bladder scan earlier this year confirmed a low ejection fraction of 18% felt to indicate biliary dyskinesia. Dr. Myna HidalgoEnnever recommended he follow-up with surgery and have his gallbladder removed before he develops further complications such as pancreatitis.   He is taking folic acid 5 mg PO daily and will continue his same regimen.   We will go ahead and plan to see him back in 6 months and he will let Clarence Hines know once he  has his gall bladder removed.  He will contact our office with any other questions or concerns. We can certainly see him sooner if need be.   Clarence GinsSarah [place], Clarence Hines 11/14/20194:10 PM

## 2018-08-18 NOTE — Telephone Encounter (Signed)
He should finish the antibiotics for total of 7 days.

## 2018-08-19 ENCOUNTER — Telehealth: Payer: Self-pay | Admitting: Hematology & Oncology

## 2018-08-19 NOTE — Telephone Encounter (Signed)
Appointments scheduled letter/calendar mailed per 11/14 los °

## 2018-09-20 ENCOUNTER — Encounter: Payer: Self-pay | Admitting: Gastroenterology

## 2018-11-22 DIAGNOSIS — R42 Dizziness and giddiness: Secondary | ICD-10-CM | POA: Insufficient documentation

## 2018-11-23 NOTE — Telephone Encounter (Signed)
I have reviewed the message and looked at the picture. Has blood in stool.  Plan: - Should get colonoscopy done. - Bre to set it up directly.

## 2018-11-30 ENCOUNTER — Ambulatory Visit (AMBULATORY_SURGERY_CENTER): Payer: Self-pay | Admitting: *Deleted

## 2018-11-30 VITALS — Ht 66.0 in | Wt 176.0 lb

## 2018-11-30 DIAGNOSIS — K921 Melena: Secondary | ICD-10-CM

## 2018-11-30 DIAGNOSIS — R194 Change in bowel habit: Secondary | ICD-10-CM

## 2018-11-30 MED ORDER — SOD PICOSULFATE-MAG OX-CIT ACD 10-3.5-12 MG-GM -GM/160ML PO SOLN: 1.0000 | ORAL | 0 refills | Status: DC

## 2018-11-30 NOTE — Progress Notes (Signed)
Patient denies any allergies to eggs or soy. Patient denies any problems with anesthesia/sedation. Patient denies any oxygen use at home. Patient denies taking any diet/weight loss medications or blood thinners. EMMI education offered, pt declined. Clenpiq free coupon given to pt.

## 2018-12-05 ENCOUNTER — Encounter: Payer: Self-pay | Admitting: Gastroenterology

## 2018-12-21 ENCOUNTER — Encounter: Payer: Self-pay | Admitting: Gastroenterology

## 2018-12-28 ENCOUNTER — Encounter: Payer: Self-pay | Admitting: Hematology & Oncology

## 2019-01-04 ENCOUNTER — Encounter: Payer: Self-pay | Admitting: Gastroenterology

## 2019-02-16 ENCOUNTER — Inpatient Hospital Stay: Payer: PRIVATE HEALTH INSURANCE | Admitting: Hematology & Oncology

## 2019-02-16 ENCOUNTER — Inpatient Hospital Stay: Payer: PRIVATE HEALTH INSURANCE | Attending: Hematology & Oncology

## 2019-03-23 ENCOUNTER — Encounter: Payer: Self-pay | Admitting: Family

## 2019-03-23 ENCOUNTER — Other Ambulatory Visit: Payer: Self-pay | Admitting: Family

## 2019-03-23 DIAGNOSIS — D580 Hereditary spherocytosis: Secondary | ICD-10-CM

## 2019-03-23 MED ORDER — FOLIC ACID 1 MG PO TABS: 5.0000 mg | ORAL_TABLET | Freq: Every day | ORAL | 6 refills | Status: DC

## 2019-06-28 ENCOUNTER — Telehealth: Payer: Self-pay | Admitting: Hematology & Oncology

## 2019-06-28 NOTE — Telephone Encounter (Signed)
Faxed medical records to: [facility] F: [phone] P: [phone] [id number]      [facility] January 01, 1986    COPY SCANNED

## 2019-10-11 DIAGNOSIS — G4710 Hypersomnia, unspecified: Secondary | ICD-10-CM | POA: Insufficient documentation

## 2019-11-15 IMAGING — CT CT ABD-PELV W/ CM
2 of 4 series · 16 of 46 positions shown, 18 images · IV contrast (APPLIED)
Comparison: 12/29/2015.

CLINICAL DATA: Right upper quadrant abdominal pain and burning for
the past 4 weeks. Right lower quadrant abdominal pain and burning
for the past 2 weeks.

EXAM:
CT ABDOMEN AND PELVIS WITH CONTRAST
TECHNIQUE: Multidetector CT imaging of the abdomen and pelvis was performed
using the standard protocol following bolus administration of
intravenous contrast.
CONTRAST:  100mL [id number] IOPAMIDOL ([id number]) INJECTION 61%

[Series 2: axial st · axial · 0.91mm/px · z∈[-520,-44]mm · 13 of 105 slices shown, 15 images]
[im 5/105  soft-tissue]
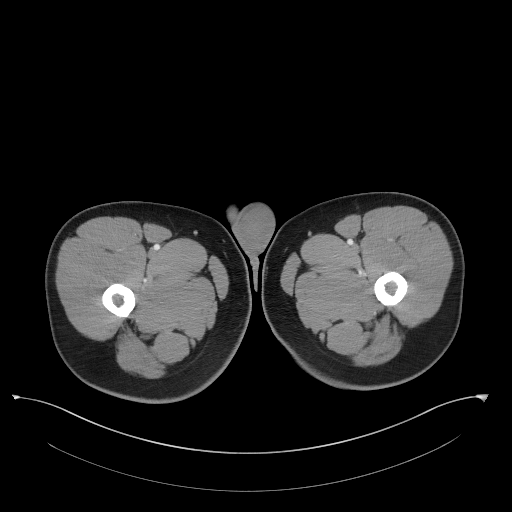
[im 5/105  bone]
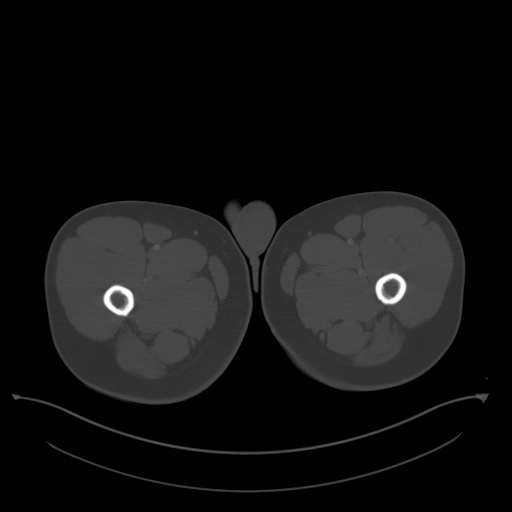
[im 13/105  soft-tissue]
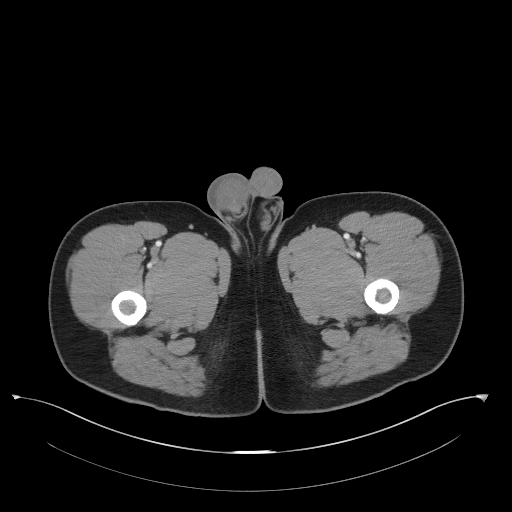
[im 21/105  soft-tissue]
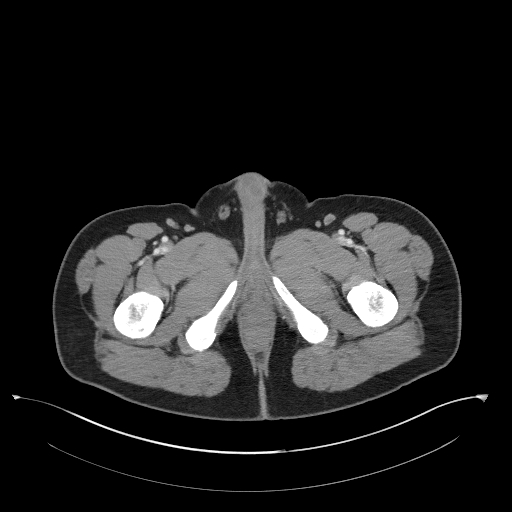
[im 30/105  soft-tissue]
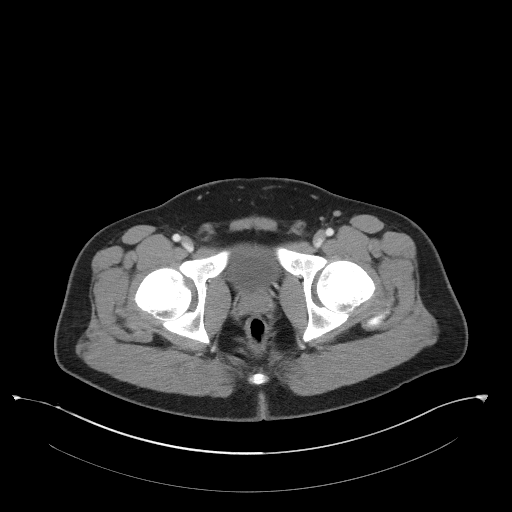
[im 38/105  soft-tissue]
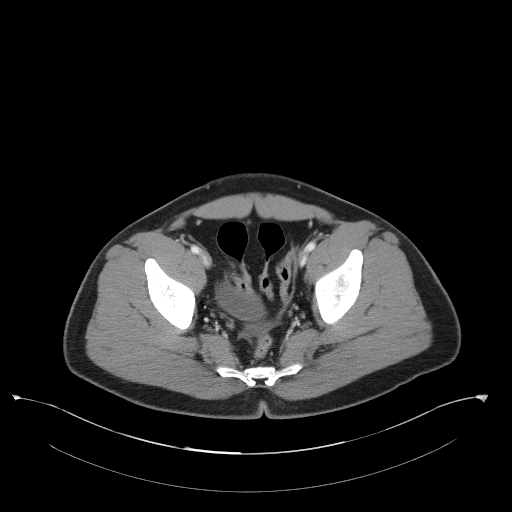
[im 46/105  soft-tissue]
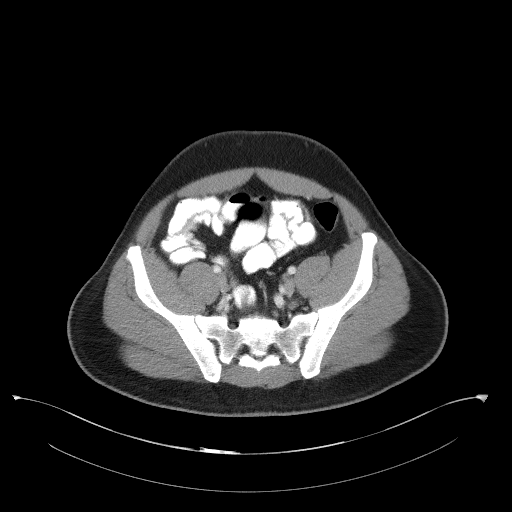
[im 55/105  soft-tissue]
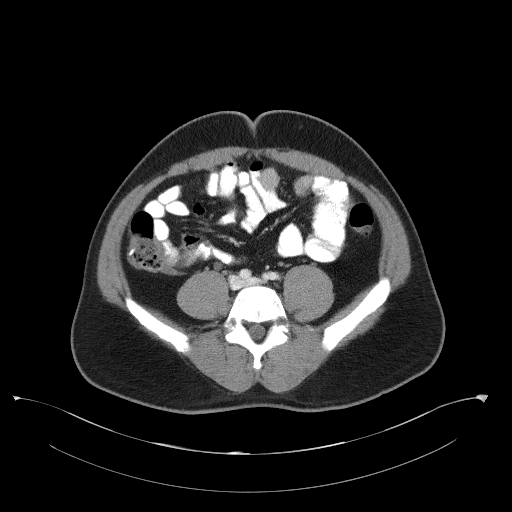
[im 59/105  soft-tissue]
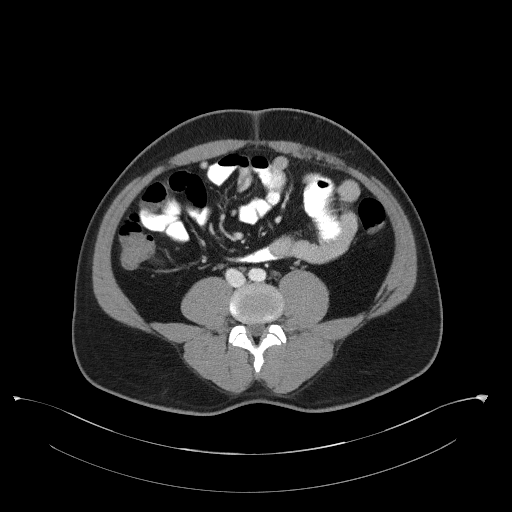
[im 67/105  soft-tissue]
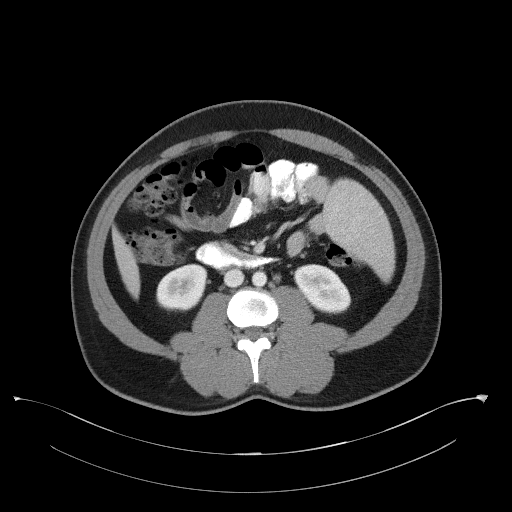
[im 67/105  bone]
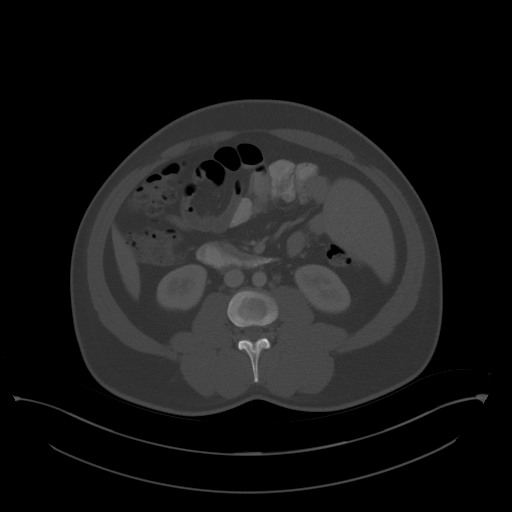
[im 75/105  soft-tissue]
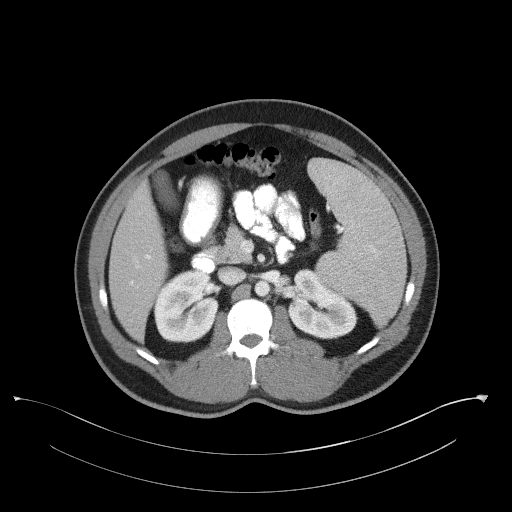
[im 84/105  soft-tissue]
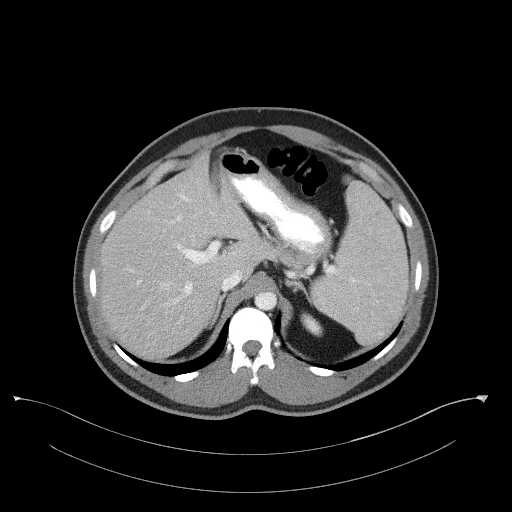
[im 92/105  soft-tissue]
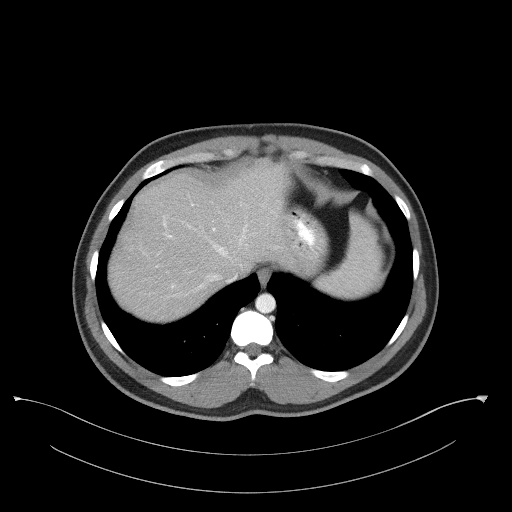
[im 100/105  soft-tissue]
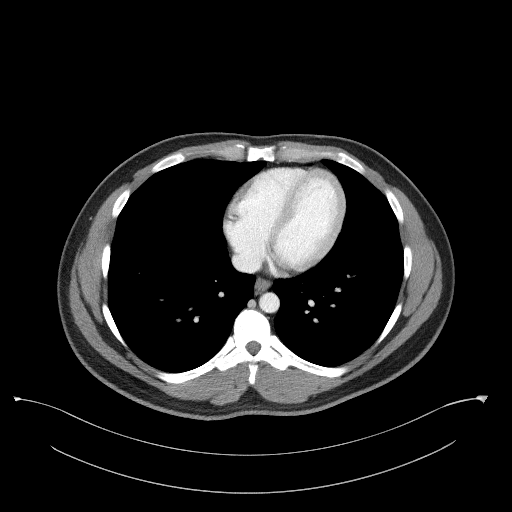

[Series 5: coronal st · coronal · 0.79mm/px · 3 of 100 slices shown]
[im 34/100  soft-tissue]
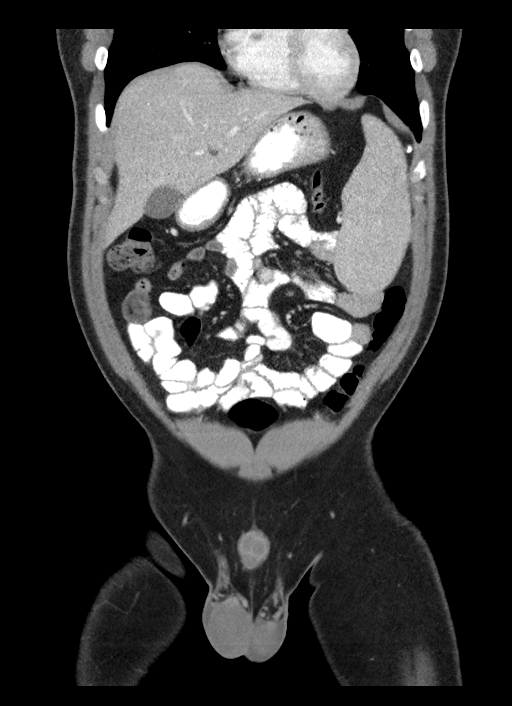
[im 45/100  soft-tissue]
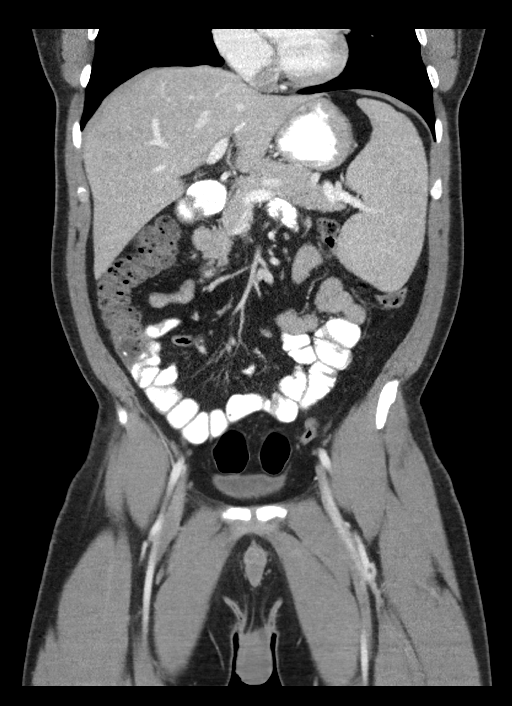
[im 56/100  soft-tissue]
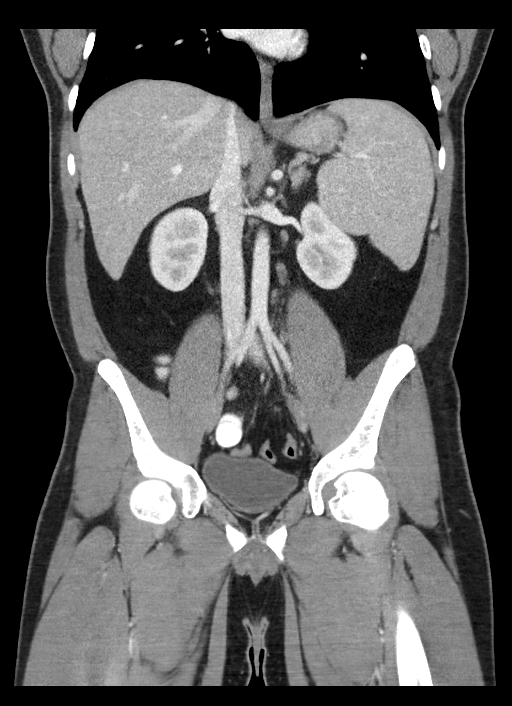

[16 of 46 positions shown; findings below may reference images not displayed]

FINDINGS: Lower chest: Unremarkable.

Hepatobiliary: Stable small right lobe liver cyst. Minimal diffuse
low density of the liver relative to the spleen. Normal appearing
gallbladder.

Pancreas: Unremarkable. No pancreatic ductal dilatation or
surrounding inflammatory changes.

Spleen: Stable enlarged spleen measuring 15.4 cm in length.

Adrenals/Urinary Tract: Small upper pole right renal cyst. Normal
appearing adrenal glands, left kidney, ureters and urinary bladder.

Stomach/Bowel: Unremarkable stomach, small bowel and colon.
Surgically absent appendix.

Vascular/Lymphatic: No significant vascular findings are present. No
enlarged abdominal or pelvic lymph nodes.

Reproductive: Prostate is unremarkable.

Other: No abdominal wall hernia or abnormality. No abdominopelvic
ascites.

Musculoskeletal: Unremarkable bones.
IMPRESSION: 1. No acute abnormality.
2. Stable splenomegaly.

## 2019-11-22 ENCOUNTER — Other Ambulatory Visit: Payer: Self-pay

## 2019-11-22 ENCOUNTER — Ambulatory Visit (INDEPENDENT_AMBULATORY_CARE_PROVIDER_SITE_OTHER): Payer: PRIVATE HEALTH INSURANCE | Admitting: Family Medicine

## 2019-11-22 DIAGNOSIS — M7540 Impingement syndrome of unspecified shoulder: Secondary | ICD-10-CM | POA: Insufficient documentation

## 2019-11-22 DIAGNOSIS — M7541 Impingement syndrome of right shoulder: Secondary | ICD-10-CM | POA: Diagnosis not present

## 2019-11-22 DIAGNOSIS — G5600 Carpal tunnel syndrome, unspecified upper limb: Secondary | ICD-10-CM | POA: Insufficient documentation

## 2019-11-22 DIAGNOSIS — G5601 Carpal tunnel syndrome, right upper limb: Secondary | ICD-10-CM

## 2019-11-22 MED ORDER — PREDNISONE 10 MG PO TABS: ORAL_TABLET | ORAL | 0 refills | Status: DC

## 2019-11-22 NOTE — Patient Instructions (Signed)
You have mild carpal tunnel syndrome. Wear the wrist brace at nighttime and as often as possible during the day Prednisone dose pack x 6 days. Don't take aleve or ibuprofen while you're on this. If not improving, will consider nerve conduction studies to assess severity. Follow up with me in 1 month.  You have rotator cuff impingement Try to avoid painful activities (overhead activities, lifting with extended arm) as much as possible. Prednisone as noted above. Can take tylenol in addition to this. Subacromial injection may be beneficial to help with pain and to decrease inflammation. Consider physical therapy with transition to home exercise program. Do home exercise program with theraband and scapular stabilization exercises daily 3 sets of 10 once a day. If not improving at follow-up we will consider further imaging, injection, physical therapy, and/or nitro patches. Follow up with me in 1 month.

## 2019-11-22 NOTE — Assessment & Plan Note (Signed)
Patient shoulder pain consistent with rotator cuff impingement.  Physical exam confirms this.  Advised to use prednisone Dosepak.  Also advised to take Tylenol as needed.  If worsening pain can consider subacromial injection.  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  Follow-up in 1 month.

## 2019-11-22 NOTE — Assessment & Plan Note (Signed)
Patient with positive Tinel's sign as well as numbness and pain in his 1st-4th digits consistent with carpal tunnel syndrome.  Ultrasound was performed confirming this.  Patient advised to use wrist brace at night and during the day as possible.  Prednisone Dosepak was also prescribed.  If worsening pain can consider nerve conduction study.  Advised to follow-up in 1 month.

## 2019-11-22 NOTE — Progress Notes (Signed)
Clarence Hines is a 34 y.o. male who presents to [facility] today for the following:  R arm pain  Patient resenting with right shoulder pain that radiates to his elbow and throughout his 1st-4th digits.  States that he initially began to have shoulder pain in 2016 when he went to grab a patient who was falling and strained his shoulder.  States that in 2016 he had a corticosteroid injection and this improved his pain - was an ESI for his radiculopathy.  For past 3 to 4 months his pain has worsened especially in his fingers.  States that he has to use a tablet at work in a stylus and he is unable to grip use due to finger numbness.  States that he gets pain and has to stop using his tablet and stylus after a few short minutes.  Denies any significant weakness or dropping objects but states his grip strength was decreased in his right upper extremity.  States that he does drive long distances especially to shower daily is wondering if this worsen the pain.  PMH reviewed. R knee pain, HTN ROS as above. Medications reviewed.  Exam:  BP 120/80   Ht 5\' 6"  (1.676 m)   Wt 180 lb (81.6 kg)   BMI 29.05 kg/m   Gen: Well NAD  MSK:  Shoulder: Inspection reveals no obvious deformity, atrophy, or asymmetry. No bruising. No swelling Palpation is normal with no TTP over AC joint, TTP of bicipital groove. Full ROM in flexion, abduction, internal/external rotation. Pain with abduction  NV intact distally Normal scapular function observed. Special Tests:  - Impingement: Pos Hawkins, Pos empty can sign. - Supraspinatous: Pos empty can.  5/5 strength with resisted flexion at 90 degrees - Infraspinatous/Teres Minor: 5/5 strength with ER - Subscapularis: negative bear hug. 5/5 strength with IR - AC Joint: Negative cross arm - Positive painful arc and no drop arm sign  Hand: Inspection: No obvious deformity. No swelling, erythema or bruising Palpation: no TTP ROM: Full ROM of the digits and wrist.  Fully able to extend and flex finger. Strength: 5/5 strength in the forearm, wrist and interosseus muscles Neurovascular: NV intact Special tests: Pos tinel's at the carpal tunnel, negative Phalen's  Sensation: diminished sensation in 2nd digit  Fingers:  No swelling in PIP, DIP joints. Flexor digitorum profundus and superficialis tendon functions are intact.  PIP joint collateral ligaments are stable   Neck: No gross deformity, swelling, bruising. No paraspinal TTP .  No midline/bony TTP. FROM. BUE strength 5/5.   Sensation diminished right 2nd digit. 2+ equal reflexes in triceps, biceps, brachioradialis tendons. Negative spurlings. NV intact distal BUEs.  Limited MSK [place]: Right median nerve area measured at 0.30mm2 at level of pisiform   Assessment and Plan: 1) Carpal tunnel syndrome Patient with positive Tinel's sign as well as numbness and pain in his 1st-4th digits consistent with carpal tunnel syndrome.  Ultrasound was performed confirming this.  Patient advised to use wrist brace at night and during the day as possible.  Prednisone Dosepak was also prescribed.  If worsening pain can consider nerve conduction study.  Advised to follow-up in 1 month.  Rotator cuff impingement syndrome Patient shoulder pain consistent with rotator cuff impingement.  Physical exam confirms this.  Advised to use prednisone Dosepak.  Also advised to take Tylenol as needed.  If worsening pain can consider subacromial injection.  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  Follow-up in 1 month.   Orpah Clinton, PGY-3 [facility] 11/22/2019 5:03 PM

## 2019-11-24 ENCOUNTER — Encounter: Payer: Self-pay | Admitting: Family Medicine

## 2020-01-25 DIAGNOSIS — K2100 Gastro-esophageal reflux disease with esophagitis, without bleeding: Secondary | ICD-10-CM | POA: Insufficient documentation

## 2020-06-04 ENCOUNTER — Telehealth: Payer: Self-pay | Admitting: Gastroenterology

## 2020-06-04 NOTE — Telephone Encounter (Signed)
Spoke to patient who reports on going rectal pain with bowel movements for 2 weeks. At first he noted small amt of bright red blood for a few days,but that has resolved on its own at this time.Over the past week patient's rectal pain has been constant,with or without bowel movement.He describes the pain as "burning and tightness" Patient had a sphincterotomy around 2015 by Dr Apolonio Schneiders. He returned to the office in 2018 for rectal pain and was noted to have an abscess and a few internal hemorrhoids.Patient was treated with a 10 day course of Augmentin with no further problems.Patient states the pain that he is experiencing feels the same as when he had the abscess.Denies fever. Appointment scheduled for  06/05/20 with Noel Gerold NP at 1:30. Notified Dr Adusmilli's office for records. They will fax over 2018 office visit notes. 2015 op note not available. All questions answered patient voiced understanding and knows to go to the [place] office for his appointment.

## 2020-06-04 NOTE — Progress Notes (Deleted)
     06/04/2020 Clarence Hines [id number] 1986-06-17   Chief Complaint:  History of Present Illness: Clarence Hines is a 34 year old male with a past medical history of anxiety, hypertension      EGD 03/07/2018 by Dr. Chales Abrahams: - Acute gastritis. Biopsied. - Normal. Biopsied. - The examination was otherwise normal.  RUQ sonogram 02/19/2018: Hepatic steatosis. Splenomegaly. 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 Consider follow-up ultrasound in 12 months to ensure stability.  CTAP 03/07/2018: Negative for bowel obstruction. Short air-fluid levels in small bowel may be due to enteritis. Liquid attenuation material throughout the colon is consistent with the patient's history of Diarrhea.  Hepatobiliary: Small cyst in the right hepatic lobe is unchanged. The liver is somewhat low attenuating relative to the spleen. Gallbladder and biliary tree are unremarkable.  Pancreas: Unremarkable. No pancreatic ductal dilatation or surrounding inflammatory changes.  Spleen: Splenomegaly is unchanged.  No focal lesion  Unchanged splenomegaly.  Mild fatty infiltration of the liver.   Current Medications, Allergies, Past Medical History, Past Surgical History, Family History and Social History were reviewed in [employer] record.   Physical Exam: There were no vitals taken for this visit. General: Well developed, w   ***male in no acute distress. Head: Normocephalic and atraumatic. Eyes: No scleral icterus. Conjunctiva pink . Ears: Normal auditory acuity. Mouth: Dentition intact. No ulcers or lesions.  Lungs: Clear throughout to auscultation. Heart: Regular rate and rhythm, no murmur. Abdomen: Soft, nontender and nondistended. No masses or hepatomegaly. Normal bowel sounds x 4 quadrants.  Rectal: *** Musculoskeletal: Symmetrical with no gross deformities. Extremities: No  edema. Neurological: Alert oriented x 4. No focal deficits.  Psychological: Alert and cooperative. Normal mood and affect  Assessment and Recommendations: ***

## 2020-06-05 ENCOUNTER — Ambulatory Visit: Payer: PRIVATE HEALTH INSURANCE | Admitting: Nurse Practitioner

## 2020-06-28 DIAGNOSIS — R0789 Other chest pain: Secondary | ICD-10-CM | POA: Insufficient documentation

## 2020-07-03 DIAGNOSIS — J343 Hypertrophy of nasal turbinates: Secondary | ICD-10-CM | POA: Insufficient documentation

## 2020-11-27 ENCOUNTER — Ambulatory Visit (INDEPENDENT_AMBULATORY_CARE_PROVIDER_SITE_OTHER): Payer: PRIVATE HEALTH INSURANCE | Admitting: Family Medicine

## 2020-11-27 ENCOUNTER — Encounter: Payer: Self-pay | Admitting: Family Medicine

## 2020-11-27 ENCOUNTER — Other Ambulatory Visit: Payer: Self-pay

## 2020-11-27 VITALS — BP 118/82 | Ht 66.0 in | Wt 190.0 lb

## 2020-11-27 DIAGNOSIS — G5601 Carpal tunnel syndrome, right upper limb: Secondary | ICD-10-CM | POA: Diagnosis not present

## 2020-11-27 MED ORDER — METHYLPREDNISOLONE ACETATE 40 MG/ML IJ SUSP
40.0000 mg | Freq: Once | INTRAMUSCULAR | Status: AC
  Administered 2020-11-27: 40 mg via INTRA_ARTICULAR

## 2020-11-27 NOTE — Patient Instructions (Signed)
You have mild carpal tunnel syndrome. Wear the wrist brace at nighttime and as often as possible during the day for the next 6 weeks (especially the nighttime splinting). Tylenol, aleve or ibuprofen if needed. If not improving, will consider nerve conduction studies to assess severity. Follow up with me in 1 month or as needed if you're doing well.

## 2020-11-27 NOTE — Progress Notes (Signed)
PCP: Curt Jews, PA-C  Subjective:   HPI: Patient is a 35 y.o. male here for right wrist pain.  Patient reports he did well after last visit following prednisone dose pack. Then about 2-3 months ago started to get pain in volar right wrist with associated numbness into radial sided digits and up his forearm. No pain with neck motions. Worse when documenting with stylus at work. Feels grip strength is worse as well. Right handed.  Past Medical History:  Diagnosis Date  . Anxiety   . 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 Hypertension     Current Outpatient Medications on File Prior to Visit  Medication Sig Dispense Refill  . B COMPLEX VITAMINS PO Take by mouth.    . dexlansoprazole (DEXILANT) 60 MG capsule Take 1 capsule (60 mg total) by mouth every other day. 90 capsule 1  . folic acid (FOLVITE) 1 MG tablet Take 5 tablets (5 mg total) by mouth daily. 150 tablet 6  . Lactobacillus Rhamnosus, GG, (CULTURELLE) CAPS Take by mouth.    . metoprolol succinate (TOPROL-XL) 25 MG 24 hr tablet Take 25 mg by mouth daily.    . multivitamin (ONE-A-DAY MEN'S) TABS tablet Take 1 tablet by mouth daily.    . Sod Picosulfate-Mag Ox-Cit Acd (CLENPIQ) 10-3.5-12 MG-GM -GM/160ML SOLN Take 1 kit by mouth as directed. 2 Bottle 0  . telmisartan (MICARDIS) 40 MG tablet Take 40 mg by mouth daily.    Marland Kitchen venlafaxine XR (EFFEXOR-XR) 37.5 MG 24 hr capsule Take 37.5 mg by mouth daily.     No current facility-administered medications on file prior to visit.    Past Surgical History:  Procedure Laterality Date  . APPENDECTOMY    . GASTROPLASTY    . SPHINCTEROTOMY    . UPPER GASTROINTESTINAL ENDOSCOPY      Allergies  Allergen Reactions  . Sulfa Antibiotics Other (See Comments)    unknown    Social History   Socioeconomic History  . Marital status: Married    Spouse name: Not on file  . Number of children: Not on file  . Years of education: Not on file  . Highest education level: Not on file   Occupational History  . Not on file  Tobacco Use  . Smoking status: Never Smoker  . Smokeless tobacco: Never Used  Vaping Use  . Vaping Use: Never used  Substance and Sexual Activity  . Alcohol use: No    Alcohol/week: 0.0 standard drinks  . Drug use: No  . Sexual activity: Not on file  Other Topics Concern  . Not on file  Social History Narrative  . Not on file   Social Determinants of Health   Financial Resource Strain: Not on file  Food Insecurity: Not on file  Transportation Needs: Not on file  Physical Activity: Not on file  Stress: Not on file  Social Connections: Not on file  Intimate Partner Violence: Not on file    Family History  Problem Relation Age of Onset  . Hypertension Mother   . Hypertension Father   . Colon cancer Neg Hx   . Colon polyps Neg Hx   . Esophageal cancer Neg Hx   . Rectal cancer Neg Hx   . Stomach cancer Neg Hx     BP 118/82   Ht 5' 6"  (1.676 m)   Wt 190 lb (86.2 kg)   BMI 30.67 kg/m   No flowsheet data found.  No flowsheet data found.  Review of Systems: See  HPI above.     Objective:  Physical Exam:  Gen: NAD, comfortable in exam room  Right wrist/hand: No deformity. FROM with 5/5 strength. Tenderness to palpation over carpal tunnel. NVI distally. Positive tinel, phalen.  Negative tinel at cubital tunnel.  MSK u/s right wrist:  Median nerve volume 0.12cm2   Assessment & Plan:  1. Right carpal tunnel syndrome - previously treated with prednisone dose pack which helped temporarily.  Steroid injection given today.  Wrist brace at nighttime.  Tylenol, aleve or ibuprofen if needed.  F/u in 1 month or prn.  After informed written consent timeout was performed.  Patient was seated on exam table.  Area overlying right volar wrist prepped with alcohol swab then utilizing ultrasound guidance patient's right carpal tunnel was injected with 54m:1mL lidocaine: depomedrol 449m  Patient tolerated procedure well without immediate  complications.

## 2020-11-27 NOTE — Addendum Note (Signed)
Addended by: Rutha Bouchard E on: 11/27/2020 10:21 AM   Modules accepted: Orders

## 2021-01-24 ENCOUNTER — Encounter: Payer: Self-pay | Admitting: Nurse Practitioner

## 2021-01-24 ENCOUNTER — Other Ambulatory Visit: Payer: Self-pay

## 2021-01-24 ENCOUNTER — Other Ambulatory Visit (INDEPENDENT_AMBULATORY_CARE_PROVIDER_SITE_OTHER): Payer: PRIVATE HEALTH INSURANCE

## 2021-01-24 ENCOUNTER — Ambulatory Visit (INDEPENDENT_AMBULATORY_CARE_PROVIDER_SITE_OTHER): Payer: PRIVATE HEALTH INSURANCE | Admitting: Nurse Practitioner

## 2021-01-24 VITALS — BP 130/90 | HR 84 | Ht 66.34 in | Wt 197.0 lb

## 2021-01-24 DIAGNOSIS — K625 Hemorrhage of anus and rectum: Secondary | ICD-10-CM

## 2021-01-24 DIAGNOSIS — R1011 Right upper quadrant pain: Secondary | ICD-10-CM | POA: Diagnosis not present

## 2021-01-24 DIAGNOSIS — R161 Splenomegaly, not elsewhere classified: Secondary | ICD-10-CM

## 2021-01-24 DIAGNOSIS — K760 Fatty (change of) liver, not elsewhere classified: Secondary | ICD-10-CM

## 2021-01-24 DIAGNOSIS — D649 Anemia, unspecified: Secondary | ICD-10-CM

## 2021-01-24 DIAGNOSIS — Z8619 Personal history of other infectious and parasitic diseases: Secondary | ICD-10-CM

## 2021-01-24 LAB — COMPREHENSIVE METABOLIC PANEL
ALT: 37 U/L (ref 0–53)
AST: 24 U/L (ref 0–37)
Albumin: 4.8 g/dL (ref 3.5–5.2)
Alkaline Phosphatase: 70 U/L (ref 39–117)
BUN: 10 mg/dL (ref 6–23)
CO2: 28 mEq/L (ref 19–32)
Calcium: 9.7 mg/dL (ref 8.4–10.5)
Chloride: 105 mEq/L (ref 96–112)
Creatinine, Ser: 0.87 mg/dL (ref 0.40–1.50)
GFR: 111.12 mL/min (ref >60.00)
Glucose, Bld: 110 mg/dL — ABNORMAL HIGH (ref 70–99)
Potassium: 4.1 mEq/L (ref 3.5–5.1)
Sodium: 141 mEq/L (ref 135–145)
Total Bilirubin: 2.3 mg/dL — ABNORMAL HIGH (ref 0.2–1.2)
Total Protein: 7.3 g/dL (ref 6.0–8.3)

## 2021-01-24 LAB — CBC WITH DIFFERENTIAL/PLATELET
Basophils Absolute: 0.1 10*3/uL (ref 0.0–0.1)
Basophils Relative: 1 % (ref 0.0–3.0)
Eosinophils Absolute: 0.2 10*3/uL (ref 0.0–0.7)
Eosinophils Relative: 2.2 % (ref 0.0–5.0)
HCT: 33.2 % — ABNORMAL LOW (ref 39.0–52.0)
Hemoglobin: 11.7 g/dL — ABNORMAL LOW (ref 13.0–17.0)
Lymphocytes Relative: 27.6 % (ref 12.0–46.0)
Lymphs Abs: 2.4 10*3/uL (ref 0.7–4.0)
MCHC: 35.2 g/dL (ref 30.0–36.0)
MCV: 80.9 fl (ref 78.0–100.0)
Monocytes Absolute: 0.5 10*3/uL (ref 0.1–1.0)
Monocytes Relative: 6.3 % (ref 3.0–12.0)
Neutro Abs: 5.5 10*3/uL (ref 1.4–7.7)
Neutrophils Relative %: 62.9 % (ref 43.0–77.0)
Platelets: 159 10*3/uL (ref 150.0–400.0)
RBC: 4.1 Mil/uL — ABNORMAL LOW (ref 4.22–5.81)
RDW: 23.1 % — ABNORMAL HIGH (ref 11.5–15.5)
WBC: 8.7 10*3/uL (ref 4.0–10.5)

## 2021-01-24 LAB — FERRITIN: Ferritin: 162.2 ng/mL (ref 22.0–322.0)

## 2021-01-24 LAB — TSH: TSH: 2.28 u[IU]/mL (ref 0.35–4.50)

## 2021-01-24 LAB — IBC PANEL
Iron: 126 ug/dL (ref 42–165)
Saturation Ratios: 41.3 % (ref 20.0–50.0)
Transferrin: 218 mg/dL (ref 212.0–360.0)

## 2021-01-24 NOTE — Patient Instructions (Addendum)
If you are age 36 or older, your body mass index should be between 23-30. Your Body mass index is 31.47 kg/m. If this is out of the aforementioned range listed, please consider follow up with your Primary Care Provider.  If you are age 74 or younger, your body mass index should be between 19-25. Your Body mass index is 31.47 kg/m. If this is out of the aformentioned range listed, please consider follow up with your Primary Care Provider.   You will be contacted by [facility] Scheduling in the next 2 days to arrange an Abdominal  Ultrasound.  The number on your caller ID will be [phone], please answer when they call.  If you have not heard from them in 2 days please call [phone] to schedule.     Your provider has requested that you go to the basement level for lab work before leaving today. Press "B" on the elevator. The lab is located at the first door on the left as you exit the elevator.  Start Miralax one capful mixed in 8 oz of water once daily as tolerated.   Start IB guard one capsule 1-3 times daily as needed.  Follow up in 4-6 weeks to discuss scheduling a Colonoscopy and possible EGD.  It was a pleasure to see you today!  Alcide Evener, NP

## 2021-01-24 NOTE — Progress Notes (Signed)
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 Pylori gastritis. Past appendectomy. He presents to our office today for further evaluation regarding RUQ pain which is similar to the abdominal pain he experienced when diagnosed with H. Pylori in 2019 (treated with Dexilant, Biaxin and Amoxicillin x 14 days with negative H. Pylori stool ag 07/2018  post treatment). 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 He now feels the RUQ pain when he deeply palpates this area. Eating does not trigger his RUQ pain. No N/V. He reports having an abdominal sonogram done in [place] in 2012 which showed gallbladder sludge. Since then, he's undergone numerous abdominal sonograms due to his splenomegaly secondary to having hereditary spherocytosis without abnormal gallbladder findings. He complains of having generalized abdominal bloat, feels gas moving around his intestines. He is taking Ibgard once daily for bloat. He has constipation, passes 2 to 3 small hard stools most days. No recent diarrhea. He has occasional bright red rectal bleeding. He reports having an anal fissure and underwent a sphincterotomy by a surgeon in [place] in 2014 and past hemorrhoid ligation. He denies ever having a colonoscopy. 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    Abdominal sonogram at [facility] 08/16/2019 report viewed on patient's phone) Spleen measured 16.4 cm  spleen.  CTAP 04/19/2018:  Small cyst in the right hepatic lobe is unchanged. The liver is somewhat low attenuating relative to the spleen. Gallbladder and biliary tree are unremarkable. Negative for bowel obstruction. Short air-fluid levels in small bowel may be due to enteritis. Liquid attenuation material throughout the colon is consistent with the patient's history of diarrhea. Unchanged splenomegaly. Mild fatty infiltration of the liver.  [facility] HIDA scan 02/23/2018: Abnormally low ejection fraction of radiotracer from the gallbladder, a finding felt to indicate a degree of biliary dyskinesia. Patient did experience abdominal cramping with the oral Ensure consumption. Cystic and common bile ducts are patent as is evidenced by visualization of gallbladder and small bowel.  EGD 03/07/2018:  - Acute gastritis. Biopsied. - Normal. Biopsied. - The examination was otherwise normal. 1. Surgical [P], small bowel - BENIGN DUODENAL MUCOSA - NO ACUTE INFLAMMATION, VILLOUS BLUNTING OR INCREASED INTRAEPITHELIAL LYMPHOCYTES 2. Surgical [P], gastric antrum and gastric body - MODERATE TO SEVERE CHRONIC GASTRITIS WITHOUT ACTIVITY - NO INTESTINAL METAPLASIA IDENTIFIED - SEE COMMENT 2. H. pylori immunohistochemistry is POSITIVE for microorganisms.  Current Outpatient Medications on File Prior to Visit  Medication Sig Dispense Refill  . B COMPLEX VITAMINS PO Take by mouth.    . dexlansoprazole (DEXILANT) 60 MG capsule Take 1 capsule (60 mg total) by mouth every other day. 90 capsule 1  . folic acid (FOLVITE) 1 MG tablet Take 5 tablets (5 mg total) by mouth daily. 150 tablet 6  . Lactobacillus Rhamnosus, GG, (CULTURELLE) CAPS Take by mouth.    . metoprolol succinate (TOPROL-XL) 25 MG 24 hr tablet Take 25 mg by mouth  daily.    . Peppermint Oil (IBGARD) 90 MG CPCR Take 1 capsule by mouth daily.    Marland Kitchen telmisartan (MICARDIS) 40 MG tablet Take 40 mg by mouth daily.    Marland Kitchen venlafaxine XR (EFFEXOR XR) 75  MG 24 hr capsule Take 75 mg by mouth daily with breakfast.     No current facility-administered medications on file prior to visit.   Allergies  Allergen Reactions  . Sulfa Antibiotics Other (See Comments)    unknown    Current Medications, Allergies, Past Medical History, Past Surgical History, Family History and Social History were reviewed in [employer] record.  Review of Systems:   Constitutional: Negative for fever, sweats, chills or weight loss.  Respiratory: Negative for shortness of breath.   Cardiovascular: Negative for chest pain, palpitations and leg swelling.  Gastrointestinal: See HPI.  Musculoskeletal: Negative for back pain or muscle aches.  Neurological: Negative for dizziness, headaches or paresthesias.    Physical Exam: Ht 5' 6.34" (1.685 m) Comment: height measured without shoes  Wt 197 lb (89.4 kg)   BMI 31.47 kg/m  General: 35 year old male in no acute distress. Head: Normocephalic and atraumatic. Eyes: No scleral icterus. Conjunctiva pink . Ears: Normal auditory acuity. Mouth: Dentition intact. No ulcers or lesions.  Neck: Thyroid feels full without discrete nodules.  Lungs: Clear throughout to auscultation. Heart: Regular rate and rhythm, no murmur. Abdomen: Soft, nontender and nondistended. No masses. No hepaotmegaly. + mild splenomegaly. Normal bowel sounds x 4 quadrants. No abdominal wall hernias.  Rectal: Deferred.  Musculoskeletal: Symmetrical with no gross deformities. Extremities: No edema. Neurological: Alert oriented x 4. No focal deficits.  Psychological: Alert and cooperative. Normal mood and affect  Assessment and Recommendations:  63. 35 year old male with a history or RUQ pain, initially associated with a positional component and more recently present with deep palpation below the right costal margin but also reminiscent of RUQ pain he experienced when diagnosed with H. Pylori per EGD in 2019. -CBC,  CMP -Abdominal sonogram  -H. Pylori stool antigen  -Consider EGD if the above evaluation unrevealing and general surgery consult to discuss elective cholecystectomy with + gallbladder dyskinesia ( EF 18.8%) per [facility] HIDA scan 02/23/2018  2. Constipation with abdominal bloat. Duodenal biopsies 2019 negative for celiac disease.  -TSH -Miralax Q HS -Ibgard 1 po 1 to 3 times daily   3. Intermittent rectal bleeding  -Eventual diagnostic colonoscopy, to discuss further at time of recommended follow up in 4 to 6 weeks   4. 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 Myna Hidalgo and Emeline Gins NP. Hg 11.2 and MCV 82.7 on 08/09/2018.  -Check iron panel to make sure he is not iron deficient   5. Fatty liver. 10 to 15 lbs weight gain.  -I discussed reducing dietary fat intake, lose weight. Patient is at increased risk for fatty liver disease.  -Labs as ordered above

## 2021-01-25 DIAGNOSIS — R1011 Right upper quadrant pain: Secondary | ICD-10-CM | POA: Insufficient documentation

## 2021-01-25 DIAGNOSIS — K625 Hemorrhage of anus and rectum: Secondary | ICD-10-CM | POA: Insufficient documentation

## 2021-01-27 LAB — TISSUE TRANSGLUTAMINASE, IGA: (tTG) Ab, IgA: <1 U/mL

## 2021-01-27 LAB — IGA: Immunoglobulin A: 310 mg/dL (ref 47–310)

## 2021-01-28 ENCOUNTER — Other Ambulatory Visit: Payer: PRIVATE HEALTH INSURANCE

## 2021-01-28 DIAGNOSIS — R1011 Right upper quadrant pain: Secondary | ICD-10-CM

## 2021-01-28 DIAGNOSIS — D649 Anemia, unspecified: Secondary | ICD-10-CM

## 2021-01-28 DIAGNOSIS — K760 Fatty (change of) liver, not elsewhere classified: Secondary | ICD-10-CM

## 2021-01-28 DIAGNOSIS — R161 Splenomegaly, not elsewhere classified: Secondary | ICD-10-CM

## 2021-01-28 DIAGNOSIS — Z8619 Personal history of other infectious and parasitic diseases: Secondary | ICD-10-CM

## 2021-01-29 LAB — HELICOBACTER PYLORI  SPECIAL ANTIGEN
MICRO NUMBER:: 11815283
SPECIMEN QUALITY: ADEQUATE

## 2021-02-02 NOTE — Progress Notes (Signed)
Agree with plan RG 

## 2021-02-05 DIAGNOSIS — G4733 Obstructive sleep apnea (adult) (pediatric): Secondary | ICD-10-CM | POA: Insufficient documentation

## 2021-02-11 ENCOUNTER — Other Ambulatory Visit: Payer: Self-pay

## 2021-02-11 ENCOUNTER — Ambulatory Visit (HOSPITAL_BASED_OUTPATIENT_CLINIC_OR_DEPARTMENT_OTHER)
Admission: RE | Admit: 2021-02-11 | Discharge: 2021-02-11 | Disposition: A | Discharge status: Home / Self Care | Payer: PRIVATE HEALTH INSURANCE | Source: Ambulatory Visit | Attending: Nurse Practitioner | Admitting: Nurse Practitioner

## 2021-02-11 DIAGNOSIS — Z8619 Personal history of other infectious and parasitic diseases: Secondary | ICD-10-CM | POA: Insufficient documentation

## 2021-02-11 DIAGNOSIS — R1011 Right upper quadrant pain: Secondary | ICD-10-CM | POA: Insufficient documentation

## 2021-02-11 DIAGNOSIS — H6980 Other specified disorders of Eustachian tube, unspecified ear: Secondary | ICD-10-CM | POA: Insufficient documentation

## 2021-02-11 DIAGNOSIS — R161 Splenomegaly, not elsewhere classified: Secondary | ICD-10-CM | POA: Diagnosis present

## 2021-02-11 DIAGNOSIS — D649 Anemia, unspecified: Secondary | ICD-10-CM | POA: Diagnosis present

## 2021-02-11 DIAGNOSIS — K760 Fatty (change of) liver, not elsewhere classified: Secondary | ICD-10-CM

## 2021-02-12 ENCOUNTER — Encounter: Payer: Self-pay | Admitting: Nurse Practitioner

## 2021-02-12 ENCOUNTER — Ambulatory Visit (INDEPENDENT_AMBULATORY_CARE_PROVIDER_SITE_OTHER): Payer: PRIVATE HEALTH INSURANCE | Admitting: Nurse Practitioner

## 2021-02-12 VITALS — BP 120/70 | HR 96 | Ht 66.34 in | Wt 196.5 lb

## 2021-02-12 DIAGNOSIS — R1011 Right upper quadrant pain: Secondary | ICD-10-CM

## 2021-02-12 DIAGNOSIS — K760 Fatty (change of) liver, not elsewhere classified: Secondary | ICD-10-CM | POA: Diagnosis not present

## 2021-02-12 DIAGNOSIS — K5900 Constipation, unspecified: Secondary | ICD-10-CM

## 2021-02-12 DIAGNOSIS — R140 Abdominal distension (gaseous): Secondary | ICD-10-CM | POA: Diagnosis not present

## 2021-02-12 NOTE — Patient Instructions (Addendum)
If you are age 37 or younger, your body mass index should be between 19-25. Your Body mass index is 31.39 kg/m. If this is out of the aformentioned range listed, please consider follow up with your Primary Care Provider.    We place placed a referral to [facility] Norm Parcel, RD)  Where: [facility] Address: [address] [place] [address] Phone: [phone]  .  You should hear from their office in the next couple weeks to schedule an appointment. If you do not hear from them please call them at [phone].  Please follow up with Dr. Chales Abrahams in 2 months to discuss a colonoscopy.  It was great seeing you today! Thank you for entrusting me with your care and choosing [facility]  Clarence Hines, CRNP

## 2021-02-12 NOTE — Progress Notes (Signed)
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 Pylori gastritis. Past appendectomy. I initially saw the patient in office on 01/24/2021 for further evaluation regarding RUQ pain. Refer to 01/24/2021 consult note for comprehensive history review.  He presents today for further follow up. He is accompanied by his sister. His RUQ pain has decreased and is noticeable only if he pushes deeply to palpate the RUQ area. H. Pylori stool antigen test 01/28/2021 was negative. An abdominal sonogram 02/11/2021 showed hepatic steatosis and splenomegaly. The spleen volume was measured with a volume of 958.5 ML. As previously reviewed, his prior abdominal sonogram 11/11/20220 measured his spleen  at 16.4 cm. 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 No time to exercise. His constipation has significantly improved after taking Miralax. TSH, TTG and IGA levels were normal. He clarified, no rectal bleeding x 6 months.    Current Medications, Allergies, Past Medical History, Past Surgical History, Family History and Social History were reviewed in [employer] record.   Review of Systems:   Constitutional: Negative for fever, sweats, chills or weight loss.  Respiratory: Negative for shortness of breath.   Cardiovascular: Negative for chest pain, palpitations and leg swelling.  Gastrointestinal: See HPI.  Musculoskeletal: Negative for back pain or muscle aches.  Neurological: Negative for dizziness, headaches or paresthesias.    Physical Exam: BP  120/70 (BP Location: Left Arm, Patient Position: Sitting, Cuff Size: Normal)   Pulse 96   Ht 5' 6.34" (1.685 m)   Wt 196 lb 8 oz (89.1 kg)   BMI 31.39 kg/m  General: 35 year old Indo-Asian male in no acute distress. Head: Normocephalic and atraumatic. Eyes: No scleral icterus. Conjunctiva pink . Ears: Normal auditory acuity. Mouth: Dentition intact. No ulcers or lesions.  Lungs: Clear throughout to auscultation. Heart: Regular rate and rhythm, no murmur. Abdomen: Soft, nontender and nondistended. No masses or hepatomegaly. Normal bowel sounds x 4 quadrants.  Rectal: Deferred.  Musculoskeletal: Symmetrical with no gross deformities. Extremities: No edema. Neurological: Alert oriented x 4. No focal deficits.  Psychological: Alert and cooperative. Normal mood and affect  Assessment and Recommendations:  1. RUQ, decreased  -Eventual EGD if RUQ pain persists or worsens , patient wishes to discuss further with Dr. Chales Abrahams at time of next follow up appointment  -Patient to call office if symptoms worsen   2. Constipation, improved  -Continue Miralax Q HS   3. 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 Myna Hidalgo   4. . Fatty liver. Elevated total bili level most likely due to hereditary spherocytosis with normal Akk phos , AST and ALT levels. -I discussed reducing dietary fat intake, lose weight. Patient is at increased risk for fatty liver disease.  -Nutritionist referral   5. Past rectal bleeding, no rectal bleeding x 6 months.  -Recommended a colonoscopy in the setting of past rectal bleeding with previously diagnosed anal fissure and hemorrhoids, recent change in bowel pattern with constipation even though improving and RUQ of unclear etiology, likely musculoskeletal component.  Patient wishes to  discuss further with Dr. Chales Abrahams at the time of his follow up appointment in 2 months

## 2021-04-14 ENCOUNTER — Ambulatory Visit: Payer: PRIVATE HEALTH INSURANCE | Admitting: Gastroenterology

## 2022-01-04 ENCOUNTER — Other Ambulatory Visit: Payer: Self-pay

## 2022-01-04 ENCOUNTER — Encounter (HOSPITAL_BASED_OUTPATIENT_CLINIC_OR_DEPARTMENT_OTHER): Payer: Self-pay | Admitting: Urology

## 2022-01-04 ENCOUNTER — Emergency Department (HOSPITAL_BASED_OUTPATIENT_CLINIC_OR_DEPARTMENT_OTHER)
Admission: EM | Admit: 2022-01-04 | Discharge: 2022-01-05 | Disposition: A | Discharge status: Home / Self Care | Payer: PRIVATE HEALTH INSURANCE | Attending: Emergency Medicine | Admitting: Emergency Medicine

## 2022-01-04 DIAGNOSIS — R112 Nausea with vomiting, unspecified: Secondary | ICD-10-CM | POA: Insufficient documentation

## 2022-01-04 DIAGNOSIS — R1084 Generalized abdominal pain: Secondary | ICD-10-CM | POA: Insufficient documentation

## 2022-01-04 DIAGNOSIS — I10 Essential (primary) hypertension: Secondary | ICD-10-CM | POA: Diagnosis not present

## 2022-01-04 DIAGNOSIS — Z79899 Other long term (current) drug therapy: Secondary | ICD-10-CM | POA: Insufficient documentation

## 2022-01-04 LAB — COMPREHENSIVE METABOLIC PANEL
ALT: 40 U/L (ref 0–44)
AST: 26 U/L (ref 15–41)
Albumin: 5 g/dL (ref 3.5–5.0)
Alkaline Phosphatase: 92 U/L (ref 38–126)
Anion gap: 8 (ref 5–15)
BUN: 15 mg/dL (ref 6–20)
CO2: 27 mmol/L (ref 22–32)
Calcium: 9.5 mg/dL (ref 8.9–10.3)
Chloride: 104 mmol/L (ref 98–111)
Creatinine, Ser: 0.83 mg/dL (ref 0.61–1.24)
GFR, Estimated: >60 mL/min (ref >60)
Glucose, Bld: 113 mg/dL — ABNORMAL HIGH (ref 70–99)
Potassium: 3.7 mmol/L (ref 3.5–5.1)
Sodium: 139 mmol/L (ref 135–145)
Total Bilirubin: 1.9 mg/dL — ABNORMAL HIGH (ref 0.3–1.2)
Total Protein: 7.7 g/dL (ref 6.5–8.1)

## 2022-01-04 LAB — CBC WITH DIFFERENTIAL/PLATELET
Abs Immature Granulocytes: 0.07 10*3/uL (ref 0.00–0.07)
Basophils Absolute: 0.1 10*3/uL (ref 0.0–0.1)
Basophils Relative: 1 %
Eosinophils Absolute: 0.3 10*3/uL (ref 0.0–0.5)
Eosinophils Relative: 2 %
HCT: 34.4 % — ABNORMAL LOW (ref 39.0–52.0)
Hemoglobin: 11.4 g/dL — ABNORMAL LOW (ref 13.0–17.0)
Immature Granulocytes: 1 %
Lymphocytes Relative: 32 %
Lymphs Abs: 4 10*3/uL (ref 0.7–4.0)
MCH: 27.3 pg (ref 26.0–34.0)
MCHC: 33.1 g/dL (ref 30.0–36.0)
MCV: 82.5 fL (ref 80.0–100.0)
Monocytes Absolute: 0.9 10*3/uL (ref 0.1–1.0)
Monocytes Relative: 8 %
Neutro Abs: 6.9 10*3/uL (ref 1.7–7.7)
Neutrophils Relative %: 56 %
Platelets: 192 10*3/uL (ref 150–400)
RBC: 4.17 MIL/uL — ABNORMAL LOW (ref 4.22–5.81)
RDW: 24.5 % — ABNORMAL HIGH (ref 11.5–15.5)
Smear Review: NORMAL
WBC: 12.2 10*3/uL — ABNORMAL HIGH (ref 4.0–10.5)
nRBC: 0.8 % — ABNORMAL HIGH (ref 0.0–0.2)

## 2022-01-04 LAB — LIPASE, BLOOD: Lipase: 45 U/L (ref 11–51)

## 2022-01-04 MED ORDER — SODIUM CHLORIDE 0.9 % IV BOLUS
1000.0000 mL | Freq: Once | INTRAVENOUS | Status: AC
  Administered 2022-01-04: 1000 mL via INTRAVENOUS

## 2022-01-04 MED ORDER — KETOROLAC TROMETHAMINE 15 MG/ML IJ SOLN
15.0000 mg | Freq: Once | INTRAMUSCULAR | Status: AC
  Administered 2022-01-04: 15 mg via INTRAVENOUS
  Filled 2022-01-04: qty 1

## 2022-01-04 MED ORDER — ONDANSETRON HCL 4 MG/2ML IJ SOLN
4.0000 mg | Freq: Once | INTRAMUSCULAR | Status: AC
  Administered 2022-01-04: 4 mg via INTRAVENOUS
  Filled 2022-01-04: qty 2

## 2022-01-04 MED ORDER — FENTANYL CITRATE PF 50 MCG/ML IJ SOSY
100.0000 ug | PREFILLED_SYRINGE | Freq: Once | INTRAMUSCULAR | Status: DC
  Filled 2022-01-04: qty 2

## 2022-01-04 MED ORDER — FENTANYL CITRATE PF 50 MCG/ML IJ SOSY: 100.0000 ug | PREFILLED_SYRINGE | Freq: Once | INTRAMUSCULAR | Status: DC | PRN

## 2022-01-04 NOTE — ED Triage Notes (Signed)
Vomiting and upper abdominal pain that started this evening ?Denies fever  ?NAD noted at this time ?

## 2022-01-04 NOTE — ED Provider Notes (Signed)
? ?[facility] ?Provider Note: Lowella Dell, MD, FACEP ? ?CSN: [id number] ?MRN: [id number] ?ARRIVAL: 01/04/22 at 2233 ?ROOM: [facility] ? ? ?CHIEF COMPLAINT  ?Vomiting ? ? ?HISTORY OF PRESENT ILLNESS  ?01/04/22 10:47 PM ?Clarence Hines is a 36 y.o. male with a history of hereditary spherocytosis.  He is here with right upper quadrant abdominal pain along with nausea and vomiting that began about 3 hours ago.  The pain has subsequently resolved but he is still nauseated.  He describes the emesis is containing food.  He denies associated diarrhea.  He rated his pain as a 7 out of 10 at its worst.  He still has some abdominal soreness following vomiting.  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  He also has a history of GERD and gallbladder sludge but his most recent RUQ ultrasound 02/11/2021 showed no sludge or gallstones. ? ? ?Past Medical History:  ?Diagnosis Date  ? Anxiety   ? Hereditary spherocytosis (HCC)   ? Hypertension   ? Sleep apnea with use of continuous positive airway pressure (CPAP)   ? ? ?Past Surgical History:  ?Procedure Laterality Date  ? APPENDECTOMY    ? GASTROPLASTY    ? SPHINCTEROTOMY    ? UPPER GASTROINTESTINAL ENDOSCOPY    ? ? ?Family History  ?Problem Relation Age of Onset  ? Hypertension Mother   ? Hypertension Father   ? Colon cancer Neg Hx   ? Colon polyps Neg Hx   ? Esophageal cancer Neg Hx   ? Rectal cancer Neg Hx   ? Stomach cancer Neg Hx   ? ? ?Social History  ? ?Tobacco Use  ? Smoking status: Never  ? Smokeless tobacco: Never  ?Vaping Use  ? Vaping Use: Never used  ?Substance Use Topics  ? Alcohol use: No  ?  Alcohol/week: 0.0 standard drinks  ? Drug use: No  ? ? ?Prior to Admission medications   ?Medication Sig Start Date End Date Taking? Authorizing Provider  ?B COMPLEX VITAMINS PO Take by mouth.    [provider]  ?dexlansoprazole (DEXILANT) 60 MG capsule Take 1 capsule (60 mg total) by mouth every other  day. 04/29/18   Lynann Bologna, MD  ?folic acid (FOLVITE) 1 MG tablet Take 5 tablets (5 mg total) by mouth daily. 03/23/19   Erenest Blank, NP  ?Lactobacillus Rhamnosus, GG, (CULTURELLE) CAPS Take by mouth.    [provider]  ?metoprolol succinate (TOPROL-XL) 25 MG 24 hr tablet Take 25 mg by mouth daily.    [provider]  ?telmisartan (MICARDIS) 40 MG tablet Take 40 mg by mouth daily.    [provider]  ?venlafaxine XR (EFFEXOR-XR) 75 MG 24 hr capsule Take 75 mg by mouth daily with breakfast.    [provider]  ? ? ?Allergies ?Sulfa antibiotics ? ? ?REVIEW OF SYSTEMS  ?Negative except as noted here or in the History of Present Illness. ? ? ?PHYSICAL EXAMINATION  ?Initial Vital Signs ?Blood pressure (!) 152/95, pulse 82, temperature 98 ?F (36.7 ?C), temperature source Oral, resp. rate 18, height 5\' 6"  (1.676 m), weight 89.1 kg, SpO2 100 %. ? ?Examination ?General: Well-developed, well-nourished male in no acute distress; appearance consistent with age of record ?HENT: normocephalic; atraumatic ?Eyes: Normal appearance; no scleral icterus ?Neck: supple ?Heart: regular rate and rhythm ?Lungs: clear to auscultation bilaterally ?Abdomen: soft; nondistended; mild diffuse tenderness; bowel sounds hypoactive ?Extremities: No deformity; full range of motion; pulses normal ?  Neurologic: Awake, alert and oriented; motor function intact in all extremities and symmetric; no facial droop ?Skin: Warm and dry ?Psychiatric: Normal mood and affect ? ? ?RESULTS  ?Summary of this visit's results, reviewed and interpreted by myself: ? ? EKG Interpretation ? ?Date/Time:    ?Ventricular Rate:    ?PR Interval:    ?QRS Duration:   ?QT Interval:    ?QTC Calculation:   ?R Axis:     ?Text Interpretation:   ?  ? ?  ? ?Laboratory Studies: ?Results for orders placed or performed during the hospital encounter of 01/04/22 (from the past 24 hour(s))  ?CBC with Differential/Platelet     Status: Abnormal  ?  Collection Time: 01/04/22 11:04 PM  ?Result Value Ref Range  ? WBC 12.2 (H) 4.0 - 10.5 K/uL  ? RBC 4.17 (L) 4.22 - 5.81 MIL/uL  ? Hemoglobin 11.4 (L) 13.0 - 17.0 g/dL  ? HCT 34.4 (L) 39.0 - 52.0 %  ? MCV 82.5 80.0 - 100.0 fL  ? MCH 27.3 26.0 - 34.0 pg  ? MCHC 33.1 30.0 - 36.0 g/dL  ? RDW 24.5 (H) 11.5 - 15.5 %  ? Platelets 192 150 - 400 K/uL  ? nRBC 0.8 (H) 0.0 - 0.2 %  ? Neutrophils Relative % 56 %  ? Neutro Abs 6.9 1.7 - 7.7 K/uL  ? Lymphocytes Relative 32 %  ? Lymphs Abs 4.0 0.7 - 4.0 K/uL  ? Monocytes Relative 8 %  ? Monocytes Absolute 0.9 0.1 - 1.0 K/uL  ? Eosinophils Relative 2 %  ? Eosinophils Absolute 0.3 0.0 - 0.5 K/uL  ? Basophils Relative 1 %  ? Basophils Absolute 0.1 0.0 - 0.1 K/uL  ? RBC Morphology See Note   ? Smear Review Normal platelet morphology   ? Immature Granulocytes 1 %  ? Abs Immature Granulocytes 0.07 0.00 - 0.07 K/uL  ? Tear Drop Cells PRESENT   ? Polychromasia PRESENT   ? Spherocytes PRESENT   ?Comprehensive metabolic panel     Status: Abnormal  ? Collection Time: 01/04/22 11:04 PM  ?Result Value Ref Range  ? Sodium 139 135 - 145 mmol/L  ? Potassium 3.7 3.5 - 5.1 mmol/L  ? Chloride 104 98 - 111 mmol/L  ? CO2 27 22 - 32 mmol/L  ? Glucose, Bld 113 (H) 70 - 99 mg/dL  ? BUN 15 6 - 20 mg/dL  ? Creatinine, Ser 0.83 0.61 - 1.24 mg/dL  ? Calcium 9.5 8.9 - 10.3 mg/dL  ? Total Protein 7.7 6.5 - 8.1 g/dL  ? Albumin 5.0 3.5 - 5.0 g/dL  ? AST 26 15 - 41 U/L  ? ALT 40 0 - 44 U/L  ? Alkaline Phosphatase 92 38 - 126 U/L  ? Total Bilirubin 1.9 (H) 0.3 - 1.2 mg/dL  ? GFR, Estimated >60 >60 mL/min  ? Anion gap 8 5 - 15  ?Lipase, blood     Status: None  ? Collection Time: 01/04/22 11:04 PM  ?Result Value Ref Range  ? Lipase 45 11 - 51 U/L  ? ?Imaging Studies: ?No results found. ? ?ED COURSE and MDM  ?Nursing notes, initial and subsequent vitals signs, including pulse oximetry, reviewed and interpreted by myself. ? ?Vitals:  ? 01/04/22 2241 01/04/22 2243  ?BP:  (!) 152/95  ?Pulse:  82  ?Resp:  18  ?Temp:  98  ?F (36.7 ?C)  ?TempSrc:  Oral  ?SpO2:  100%  ?Weight: 89.1 kg   ?Height: 5\' 6"  (1.676 m)   ? ?  Medications  ?sodium chloride 0.9 % bolus 1,000 mL (1,000 mLs Intravenous New Bag/Given 01/04/22 2317)  ?ondansetron [facility](ZOFRAN) injection 4 mg (4 mg Intravenous Given 01/04/22 2300)  ?ketorolac (TORADOL) 15 MG/ML injection 15 mg (15 mg Intravenous Given 01/04/22 2316)  ? ?12:10 AM ?Patient feeling much better after IV fluids and medications.  He does not like narcotics but would like a prescription for Bentyl as he states this has worked well in the past for abdominal pain.  He is already on Dexilant for GERD. ? ? ?PROCEDURES  ?Procedures ? ? ?ED DIAGNOSES  ? ?  ICD-10-CM   ?1. Nausea and vomiting in adult  R11.2   ?  ?2. Generalized abdominal pain  R10.84   ?  ? ? ? ?  ?Paula LibraMolpus, Massey Ruhland, MD ?01/05/22 0012 ? ?

## 2022-01-05 MED ORDER — ONDANSETRON 8 MG PO TBDP: 8.0000 mg | ORAL_TABLET | Freq: Three times a day (TID) | ORAL | 0 refills | Status: DC | PRN

## 2022-01-05 MED ORDER — DICYCLOMINE HCL 20 MG PO TABS: 20.0000 mg | ORAL_TABLET | Freq: Four times a day (QID) | ORAL | 0 refills | Status: AC | PRN

## 2022-01-05 MED ORDER — DICYCLOMINE HCL 10 MG PO CAPS
20.0000 mg | ORAL_CAPSULE | Freq: Once | ORAL | Status: AC
  Administered 2022-01-05: 20 mg via ORAL
  Filled 2022-01-05: qty 2

## 2022-04-14 ENCOUNTER — Other Ambulatory Visit: Payer: Self-pay

## 2022-04-14 ENCOUNTER — Encounter (HOSPITAL_BASED_OUTPATIENT_CLINIC_OR_DEPARTMENT_OTHER): Payer: Self-pay | Admitting: Emergency Medicine

## 2022-04-14 ENCOUNTER — Emergency Department (HOSPITAL_BASED_OUTPATIENT_CLINIC_OR_DEPARTMENT_OTHER)
Admission: EM | Admit: 2022-04-14 | Discharge: 2022-04-14 | Disposition: A | Discharge status: Home / Self Care | Payer: PRIVATE HEALTH INSURANCE | Attending: Emergency Medicine | Admitting: Emergency Medicine

## 2022-04-14 DIAGNOSIS — T50905A Adverse effect of unspecified drugs, medicaments and biological substances, initial encounter: Secondary | ICD-10-CM | POA: Diagnosis not present

## 2022-04-14 DIAGNOSIS — R252 Cramp and spasm: Secondary | ICD-10-CM | POA: Diagnosis not present

## 2022-04-14 DIAGNOSIS — I10 Essential (primary) hypertension: Secondary | ICD-10-CM | POA: Insufficient documentation

## 2022-04-14 DIAGNOSIS — R1030 Lower abdominal pain, unspecified: Secondary | ICD-10-CM | POA: Insufficient documentation

## 2022-04-14 DIAGNOSIS — R197 Diarrhea, unspecified: Secondary | ICD-10-CM | POA: Insufficient documentation

## 2022-04-14 DIAGNOSIS — R140 Abdominal distension (gaseous): Secondary | ICD-10-CM | POA: Diagnosis not present

## 2022-04-14 DIAGNOSIS — E119 Type 2 diabetes mellitus without complications: Secondary | ICD-10-CM | POA: Insufficient documentation

## 2022-04-14 DIAGNOSIS — Z79899 Other long term (current) drug therapy: Secondary | ICD-10-CM | POA: Insufficient documentation

## 2022-04-14 DIAGNOSIS — T887XXA Unspecified adverse effect of drug or medicament, initial encounter: Secondary | ICD-10-CM

## 2022-04-14 HISTORY — DX: Type 2 diabetes mellitus without complications: E11.9

## 2022-04-14 LAB — COMPREHENSIVE METABOLIC PANEL
ALT: 35 U/L (ref 0–44)
AST: 28 U/L (ref 15–41)
Albumin: 4.8 g/dL (ref 3.5–5.0)
Alkaline Phosphatase: 83 U/L (ref 38–126)
Anion gap: 6 (ref 5–15)
BUN: 6 mg/dL (ref 6–20)
CO2: 27 mmol/L (ref 22–32)
Calcium: 9.5 mg/dL (ref 8.9–10.3)
Chloride: 108 mmol/L (ref 98–111)
Creatinine, Ser: 0.78 mg/dL (ref 0.61–1.24)
GFR, Estimated: >60 mL/min (ref >60)
Glucose, Bld: 104 mg/dL — ABNORMAL HIGH (ref 70–99)
Potassium: 3.6 mmol/L (ref 3.5–5.1)
Sodium: 141 mmol/L (ref 135–145)
Total Bilirubin: 2.3 mg/dL — ABNORMAL HIGH (ref 0.3–1.2)
Total Protein: 7.3 g/dL (ref 6.5–8.1)

## 2022-04-14 LAB — CBC
HCT: 32 % — ABNORMAL LOW (ref 39.0–52.0)
Hemoglobin: 10.7 g/dL — ABNORMAL LOW (ref 13.0–17.0)
MCH: 27.6 pg (ref 26.0–34.0)
MCHC: 33.4 g/dL (ref 30.0–36.0)
MCV: 82.5 fL (ref 80.0–100.0)
Platelets: 156 10*3/uL (ref 150–400)
RBC: 3.88 MIL/uL — ABNORMAL LOW (ref 4.22–5.81)
RDW: 23.4 % — ABNORMAL HIGH (ref 11.5–15.5)
WBC: 8.8 10*3/uL (ref 4.0–10.5)
nRBC: 0.2 % (ref 0.0–0.2)

## 2022-04-14 LAB — URINALYSIS, ROUTINE W REFLEX MICROSCOPIC
Bilirubin Urine: NEGATIVE
Glucose, UA: NEGATIVE mg/dL
Hgb urine dipstick: NEGATIVE
Ketones, ur: NEGATIVE mg/dL
Leukocytes,Ua: NEGATIVE
Nitrite: NEGATIVE
Protein, ur: NEGATIVE mg/dL
Specific Gravity, Urine: 1.015 (ref 1.005–1.030)
pH: 6 (ref 5.0–8.0)

## 2022-04-14 LAB — LIPASE, BLOOD: Lipase: 38 U/L (ref 11–51)

## 2022-04-14 MED ORDER — ONDANSETRON HCL 4 MG/2ML IJ SOLN
4.0000 mg | Freq: Once | INTRAMUSCULAR | Status: AC
  Administered 2022-04-14: 4 mg via INTRAVENOUS
  Filled 2022-04-14: qty 2

## 2022-04-14 MED ORDER — LACTATED RINGERS IV BOLUS
1000.0000 mL | Freq: Once | INTRAVENOUS | Status: AC
  Administered 2022-04-14: 1000 mL via INTRAVENOUS

## 2022-04-14 MED ORDER — DICYCLOMINE HCL 10 MG/ML IM SOLN
20.0000 mg | Freq: Once | INTRAMUSCULAR | Status: AC
  Administered 2022-04-14: 20 mg via INTRAMUSCULAR
  Filled 2022-04-14: qty 2

## 2022-04-14 NOTE — ED Provider Notes (Signed)
[facility] EMERGENCY DEPARTMENT Provider Note   CSN: [id number] Arrival date & time: 04/14/22  1044     History {Add pertinent medical, surgical, social history, OB history to HPI:1} Chief Complaint  Patient presents with   Diarrhea    Clarence Hines is a 36 y.o. male.  HPI      Increase in mounjuro 2 weeks ago (when it started thougth was metformin, stopped it)  First 2.5 for one month, 5 for one mont, 7.5 started this past SUnday, since that past Sunday to Saturday, went to pcp and changed it but was not able to take the 5 because they didn't have it.  Then started having bloating, abdominal pain, cramping on Sunday after taking it BLoating, diarrhea watery over 10 times a day, rectal pain and burning as well, cramping. 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 TOok bentyl and pain not tolerable. Drinking gatorade, water but feeling dehydrated still, dry mouth  No vomiting, fever, no urinary symptoms.  Abdomina pain prior to having diarrhea, urge to go with bloating, after using bathroom it is better.  Worse when moving around.    3 days before 6/27 had some issues, thought it was metformin and stopped it-- Past Medical History:  Diagnosis Date   Anxiety    Diabetes mellitus without complication (HCC)    Hereditary spherocytosis (HCC)    Hypertension    Sleep apnea with use of continuous positive airway pressure (CPAP)     Past Surgical History:  Procedure Laterality Date   APPENDECTOMY     GASTROPLASTY     SPHINCTEROTOMY     UPPER GASTROINTESTINAL ENDOSCOPY        Home Medications Prior to Admission medications   Medication Sig Start Date End Date Taking? Authorizing Provider  B COMPLEX VITAMINS PO Take by mouth.    [provider]  dexlansoprazole (DEXILANT) 60  MG capsule Take 1 capsule (60 mg total) by mouth every other day. 04/29/18   Lynann Bologna, MD  dicyclomine (BENTYL) 20 MG tablet Take 1 tablet (20 mg total) by mouth every 6 (six) hours as needed (Abdominal cramping). 01/05/22   Molpus, John, MD  folic acid (FOLVITE) 1 MG tablet Take 5 tablets (5 mg total) by mouth daily. 03/23/19   Erenest Blank, NP  Lactobacillus Rhamnosus, GG, (CULTURELLE) CAPS Take by mouth.    [provider]  metoprolol succinate (TOPROL-XL) 25 MG 24 hr tablet Take 25 mg by mouth daily.    [provider]  MOUNJARO 2.5 MG/0.5ML Pen SMARTSIG:2.5 Milligram(s) SUB-Q Once a Week 11/07/21   [provider]  ondansetron (ZOFRAN-ODT) 8 MG disintegrating tablet Take 1 tablet (8 mg total) by mouth every 8 (eight) hours as needed for vomiting or nausea. 8mg  ODT q4 hours prn nausea 01/05/22   Molpus, John, MD  telmisartan (MICARDIS) 40 MG tablet Take 40 mg by mouth daily.    [provider]  venlafaxine XR (EFFEXOR-XR) 75 MG 24 hr capsule Take 75 mg by mouth daily with breakfast.    [provider]      Allergies    Sulfa antibiotics    Review of Systems   Review of Systems  Physical Exam Updated Vital Signs BP (!) 145/88 (BP Location: Left Arm)   Pulse 91   Temp 98.7 F (37.1 C) (  Oral)   Resp 16   Ht 5\' 6"  (1.676 m)   Wt 83.9 kg   SpO2 100%   BMI 29.86 kg/m  Physical Exam  ED Results / Procedures / Treatments   Labs (all labs ordered are listed, but only abnormal results are displayed) Labs Reviewed  CBC - Abnormal; Notable for the following components:      Result Value   RBC 3.88 (*)    Hemoglobin 10.7 (*)    HCT 32.0 (*)    RDW 23.4 (*)    All other components within normal limits  URINALYSIS, ROUTINE W REFLEX MICROSCOPIC  LIPASE, BLOOD  COMPREHENSIVE METABOLIC PANEL    EKG None  Radiology No results found.  Procedures Procedures  {Document cardiac monitor, telemetry assessment procedure when  appropriate:1}  Medications Ordered in ED Medications  lactated ringers bolus 1,000 mL (1,000 mLs Intravenous New Bag/Given 04/14/22 1227)  ondansetron (ZOFRAN) injection 4 mg (4 mg Intravenous Given 04/14/22 1227)  dicyclomine (BENTYL) injection 20 mg (20 mg Intramuscular Given 04/14/22 1228)    ED Course/ Medical Decision Making/ A&P                           Medical Decision Making Amount and/or Complexity of Data Reviewed Labs: ordered.  Risk Prescription drug management.   ***  {Document critical care time when appropriate:1} {Document review of labs and clinical decision tools ie heart score, Chads2Vasc2 etc:1}  {Document your independent review of radiology images, and any outside records:1} {Document your discussion with family members, caretakers, and with consultants:1} {Document social determinants of health affecting pt's care:1} {Document your decision making why or why not admission, treatments were needed:1} Final Clinical Impression(s) / ED Diagnoses Final diagnoses:  None    Rx / DC Orders ED Discharge Orders     None

## 2022-04-14 NOTE — ED Triage Notes (Signed)
Pt states he increased Mounjuro prescription 2 weeks ago.  Pt states he had been titrating up on dosage for a few months.  Symptoms of GI distress have increased since last increase.  Gas, bloating, diarrhea.  Pt now having leg cramps.

## 2022-04-14 NOTE — ED Notes (Signed)
Patient given discharge instructions, all questions answered. Patient in possession of all belongings, directed to the discharge area  

## 2022-07-24 ENCOUNTER — Telehealth: Payer: Self-pay | Admitting: *Deleted

## 2022-07-24 NOTE — Telephone Encounter (Signed)
Call received from patient requesting an appt with Dr. Marin Olp.  Pt informed to have a referral faxed to [phone] since he has not been seen in this office since 2019.  Pt states that he will contact his PCP for a referral today and is appreciative of assistance.

## 2022-08-03 ENCOUNTER — Other Ambulatory Visit: Payer: Self-pay | Admitting: Family

## 2022-08-03 DIAGNOSIS — D580 Hereditary spherocytosis: Secondary | ICD-10-CM

## 2022-08-04 ENCOUNTER — Encounter: Payer: Self-pay | Admitting: Family

## 2022-08-04 ENCOUNTER — Inpatient Hospital Stay (HOSPITAL_BASED_OUTPATIENT_CLINIC_OR_DEPARTMENT_OTHER): Payer: PRIVATE HEALTH INSURANCE | Admitting: Family

## 2022-08-04 ENCOUNTER — Inpatient Hospital Stay: Payer: PRIVATE HEALTH INSURANCE | Attending: Family

## 2022-08-04 VITALS — BP 133/80 | HR 101 | Temp 98.7°F | Resp 18 | Wt 188.0 lb

## 2022-08-04 DIAGNOSIS — D508 Other iron deficiency anemias: Secondary | ICD-10-CM

## 2022-08-04 DIAGNOSIS — D580 Hereditary spherocytosis: Secondary | ICD-10-CM | POA: Diagnosis not present

## 2022-08-04 LAB — CMP (CANCER CENTER ONLY)
ALT: 32 U/L (ref 0–44)
AST: 22 U/L (ref 15–41)
Albumin: 5.4 g/dL — ABNORMAL HIGH (ref 3.5–5.0)
Alkaline Phosphatase: 101 U/L (ref 38–126)
Anion gap: 8 (ref 5–15)
BUN: 16 mg/dL (ref 6–20)
CO2: 26 mmol/L (ref 22–32)
Calcium: 10.3 mg/dL (ref 8.9–10.3)
Chloride: 103 mmol/L (ref 98–111)
Creatinine: 0.93 mg/dL (ref 0.61–1.24)
GFR, Estimated: >60 mL/min (ref >60)
Glucose, Bld: 112 mg/dL — ABNORMAL HIGH (ref 70–99)
Potassium: 4.3 mmol/L (ref 3.5–5.1)
Sodium: 137 mmol/L (ref 135–145)
Total Bilirubin: 1.5 mg/dL — ABNORMAL HIGH (ref 0.3–1.2)
Total Protein: 7.5 g/dL (ref 6.5–8.1)

## 2022-08-04 LAB — CBC WITH DIFFERENTIAL (CANCER CENTER ONLY)
Abs Immature Granulocytes: 0.12 10*3/uL — ABNORMAL HIGH (ref 0.00–0.07)
Basophils Absolute: 0.1 10*3/uL (ref 0.0–0.1)
Basophils Relative: 1 %
Eosinophils Absolute: 0.3 10*3/uL (ref 0.0–0.5)
Eosinophils Relative: 2 %
HCT: 35.7 % — ABNORMAL LOW (ref 39.0–52.0)
Hemoglobin: 12.3 g/dL — ABNORMAL LOW (ref 13.0–17.0)
Immature Granulocytes: 1 %
Lymphocytes Relative: 22 %
Lymphs Abs: 2.9 10*3/uL (ref 0.7–4.0)
MCH: 28.3 pg (ref 26.0–34.0)
MCHC: 34.5 g/dL (ref 30.0–36.0)
MCV: 82.1 fL (ref 80.0–100.0)
Monocytes Absolute: 0.9 10*3/uL (ref 0.1–1.0)
Monocytes Relative: 7 %
Neutro Abs: 9 10*3/uL — ABNORMAL HIGH (ref 1.7–7.7)
Neutrophils Relative %: 67 %
Platelet Count: 200 10*3/uL (ref 150–400)
RBC: 4.35 MIL/uL (ref 4.22–5.81)
RDW: 25.1 % — ABNORMAL HIGH (ref 11.5–15.5)
WBC Count: 13.2 10*3/uL — ABNORMAL HIGH (ref 4.0–10.5)
nRBC: 0.5 % — ABNORMAL HIGH (ref 0.0–0.2)

## 2022-08-04 LAB — SAVE SMEAR(SSMR), FOR PROVIDER SLIDE REVIEW

## 2022-08-04 LAB — RETICULOCYTES
Immature Retic Fract: 21.4 % — ABNORMAL HIGH (ref 2.3–15.9)
RBC.: 4.35 MIL/uL (ref 4.22–5.81)
Retic Count, Absolute: 598.2 10*3/uL — ABNORMAL HIGH (ref 19.0–186.0)
Retic Ct Pct: 13.8 % — ABNORMAL HIGH (ref 0.4–3.1)

## 2022-08-04 LAB — IRON AND IRON BINDING CAPACITY (CC-WL,HP ONLY)
Iron: 173 ug/dL (ref 45–182)
Saturation Ratios: 51 % — ABNORMAL HIGH (ref 17.9–39.5)
TIBC: 340 ug/dL (ref 250–450)
UIBC: 167 ug/dL (ref 117–376)

## 2022-08-04 LAB — LACTATE DEHYDROGENASE: LDH: 205 U/L — ABNORMAL HIGH (ref 98–192)

## 2022-08-04 LAB — FERRITIN: Ferritin: 165 ng/mL (ref 24–336)

## 2022-08-04 MED ORDER — FOLIC ACID 1 MG PO TABS: 5.0000 mg | ORAL_TABLET | Freq: Every day | ORAL | 11 refills | Status: AC

## 2022-08-04 NOTE — Progress Notes (Signed)
Hematology and Oncology Follow Up Visit  Clarence Hines [id number] 1986/07/13 36 y.o. 08/04/2022   Principle Diagnosis:  Hereditary spherocytosis    Current Therapy:        Folic acid 5 mg PO daily   Interim History:  Clarence Hines is here today to re-establish care. We last saw him in 2019. 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  He states that the prednisone drop he puts in in his left eye four times a day can cause dry eye. No pain, swelling, loss of or blurred vision.  He does note fatigue.  Hgb remains stable at 12.3, MCV 82, platelets 200 and WBC 13.2.  He continues to take his B complex and folic acid 5 mg PO daily.  He has had no issue with blood loss. No bruising or petechiae.  He states that he did get a steroid injection in the right foot recently to treat his plantar fascitis.  No fever, chills, n/v, cough, rash, dizziness, SOB, chest pain, palpitations, abdominal pain or changes in bowel or bladder habits.  Spleen and gallbladder are still in.  No swelling, numbness or tingling in his extremities at this time.  No falls or syncope reported.  Appetite and hydration are good. He only eats chicken. No red meat or fish. Weight is stable at 188. He has started [facility] to help with weight loss.   ECOG Performance Status: 1 - Symptomatic but completely ambulatory  Medications:  Allergies as of 08/04/2022       Reactions   Sulfa Antibiotics Other (See Comments)   unknown        Medication List        Accurate as of August 04, 2022  9:46 AM. If you have any questions, ask your nurse or doctor.          B COMPLEX VITAMINS PO Take by mouth.   Culturelle Caps Take by mouth.   dexlansoprazole 60 MG capsule Commonly known as: Dexilant Take 1 capsule (60 mg total) by mouth every other day.   dicyclomine 20 MG  tablet Commonly known as: BENTYL Take 1 tablet (20 mg total) by mouth every 6 (six) hours as needed (Abdominal cramping).   ethambutol 100 MG tablet Commonly known as: MYAMBUTOL Take by mouth daily. Unsure dose   folic acid 1 MG tablet Commonly known as: FOLVITE Take 5 tablets (5 mg total) by mouth daily.   isoniazid 100 MG tablet Commonly known as: NYDRAZID Take 100 mg by mouth daily. Unsure dose   metoprolol succinate 25 MG 24 hr tablet Commonly known as: TOPROL-XL Take 25 mg by mouth daily.   Mounjaro 2.5 MG/0.5ML Pen Generic drug: tirzepatide Inject 5 mg into the skin once a week.   ondansetron 8 MG disintegrating tablet Commonly known as: ZOFRAN-ODT Take 1 tablet (8 mg total) by mouth every 8 (eight) hours as needed for vomiting or nausea. 8mg  ODT q4 hours prn nausea   pyrazinamide 500 MG tablet Take 500 mg by mouth daily. Unsure dose   rifampin 300 MG capsule Commonly known as: RIFADIN Take 600 mg by mouth daily. Unsure dose   telmisartan 40 MG tablet Commonly known as: MICARDIS Take 80 mg by mouth daily.   venlafaxine XR 75 MG 24 hr capsule Commonly known as: EFFEXOR-XR Take 75 mg by mouth daily with breakfast.  Allergies:  Allergies  Allergen Reactions   Sulfa Antibiotics Other (See Comments)    unknown    Past Medical History, Surgical history, Social history, and Family History were reviewed and updated.  Review of Systems: All other 10 point review of systems is negative.   Physical Exam:  weight is 188 lb (85.3 kg). His oral temperature is 98.7 F (37.1 C). His blood pressure is 133/80 and his pulse is 101 (abnormal). His respiration is 18 and oxygen saturation is 100%.   Wt Readings from Last 3 Encounters:  08/04/22 188 lb (85.3 kg)  04/14/22 185 lb (83.9 kg)  01/04/22 196 lb 6.9 oz (89.1 kg)    Ocular: Sclerae unicteric, pupils equal, round and reactive to light Ear-nose-throat: Oropharynx clear, dentition fair Lymphatic: No  cervical or supraclavicular adenopathy Lungs no rales or rhonchi, good excursion bilaterally Heart regular rate and rhythm, no murmur appreciated Abd soft, nontender, positive bowel sounds MSK no focal spinal tenderness, no joint edema Neuro: non-focal, well-oriented, appropriate affect Breasts: Deferred   Lab Results  Component Value Date   WBC 13.2 (H) 08/04/2022   HGB 12.3 (L) 08/04/2022   HCT 35.7 (L) 08/04/2022   MCV 82.1 08/04/2022   PLT 200 08/04/2022   Lab Results  Component Value Date   FERRITIN 162.2 01/24/2021   IRON 126 01/24/2021   TIBC 273 02/11/2017   UIBC 158 02/11/2017   IRONPCTSAT 41.3 01/24/2021   Lab Results  Component Value Date   RETICCTPCT 13.8 (H) 08/04/2022   RBC 4.35 08/04/2022   RBC 4.35 08/04/2022   No results found for: "KPAFRELGTCHN", "LAMBDASER", "KAPLAMBRATIO" No results found for: "IGGSERUM", "IGA", "IGMSERUM" No results found for: "TOTALPROTELP", "ALBUMINELP", "A1GS", "A2GS", "BETS", "BETA2SER", "GAMS", "MSPIKE", "SPEI"   Chemistry      Component Value Date/Time   NA 137 08/04/2022 0836   NA 140 02/22/2017 0917   K 4.3 08/04/2022 0836   K 3.7 02/22/2017 0917   CL 103 08/04/2022 0836   CO2 26 08/04/2022 0836   CO2 25 02/22/2017 0917   BUN 16 08/04/2022 0836   BUN 11.2 02/22/2017 0917   CREATININE 0.93 08/04/2022 0836   CREATININE 0.9 02/22/2017 0917      Component Value Date/Time   CALCIUM 10.3 08/04/2022 0836   CALCIUM 9.4 02/22/2017 0917   ALKPHOS 101 08/04/2022 0836   ALKPHOS 84 02/22/2017 0917   AST 22 08/04/2022 0836   AST 27 02/22/2017 0917   ALT 32 08/04/2022 0836   ALT 34 02/22/2017 0917   BILITOT 1.5 (H) 08/04/2022 0836   BILITOT 2.64 (H) 02/22/2017 0917       Impression and Plan: Clarence Hines is a very pleasant 36 yo [place] gentleman with hereditary spherocytosis.  He continues to do well from a hematology stand point. Labs reviewed with Dr. Myna Hidalgo. No intervention needed at this time.  Iron studies are  pending. We will replace if needed.   We will recheck labs in 6 months and follow-up in 1 year.  He can contact our office with any questions or concerns.   Eileen Stanford, NP 10/31/20239:46 AM

## 2022-10-22 IMAGING — US US ABDOMEN COMPLETE
1 series · 14 of 25 positions shown · non-contrast
Comparison: CT 04/19/2018, ultrasound 02/19/2018

CLINICAL DATA: Right upper quadrant pain

EXAM:
ABDOMEN ULTRASOUND COMPLETE

[Series 1: us abdomen complete · 14 of 66 slices shown]
[im 1/66]
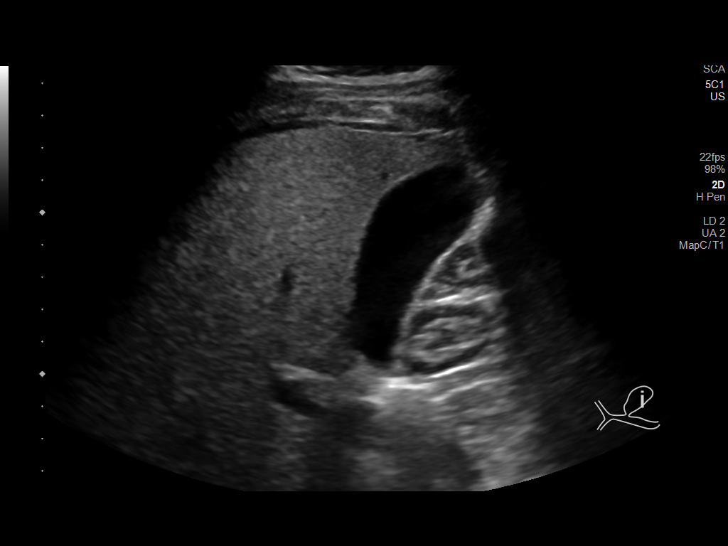
[im 6/66]
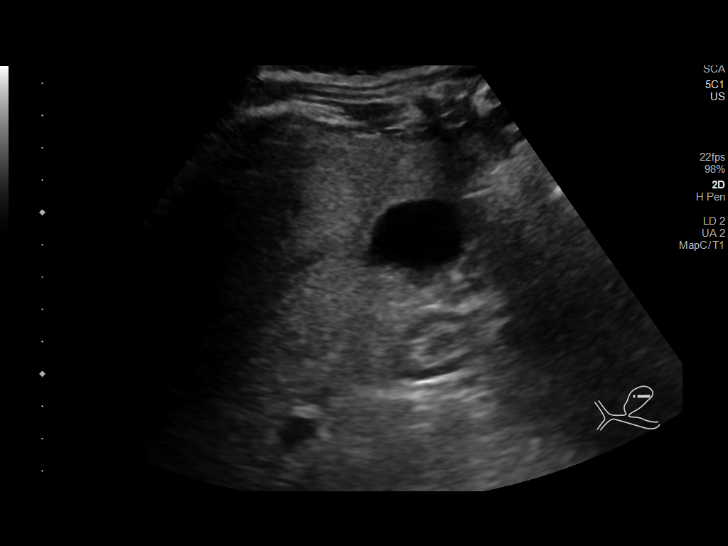
[im 11/66]
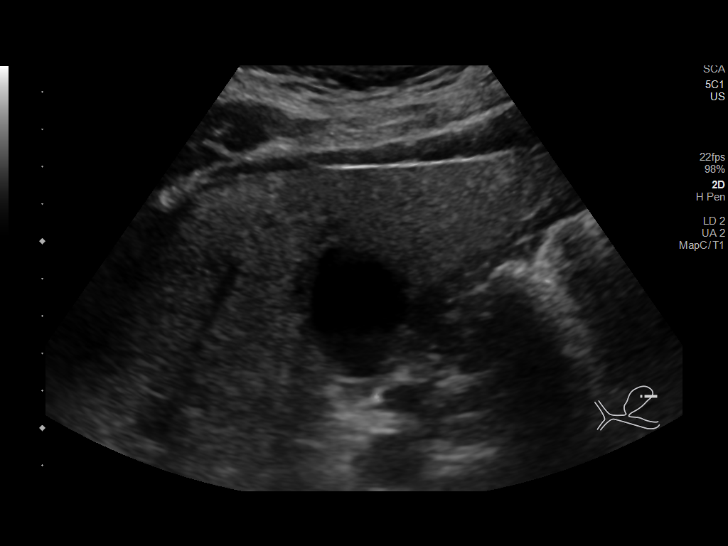
[im 17/66]
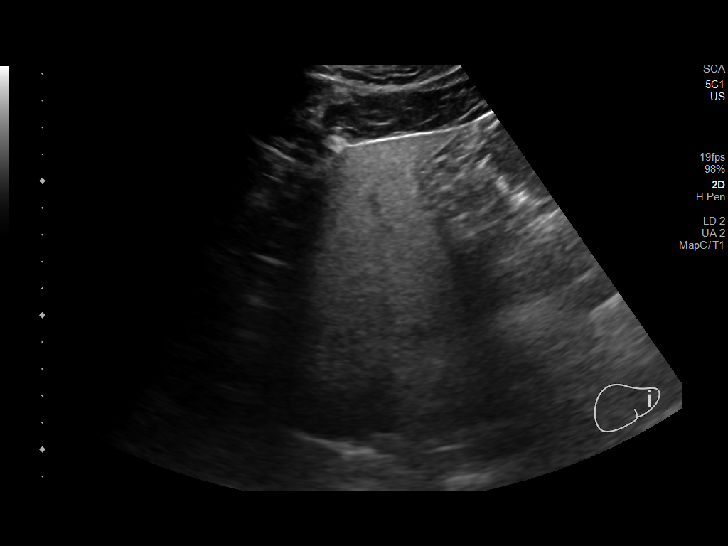
[im 22/66]
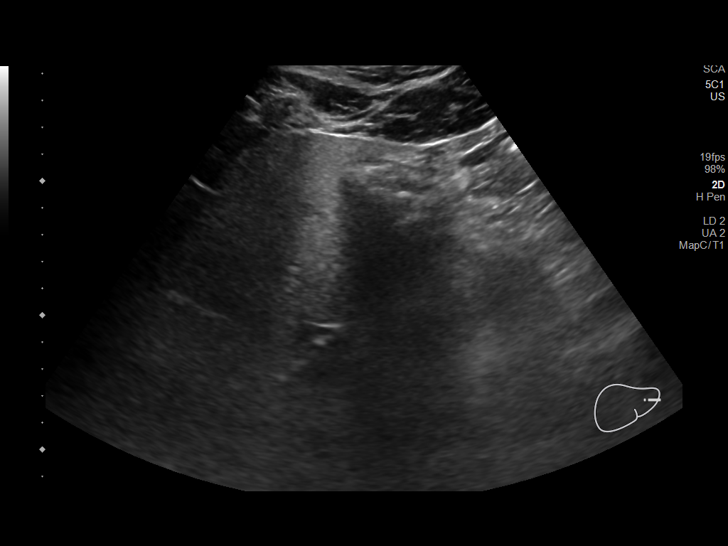
[im 25/66]
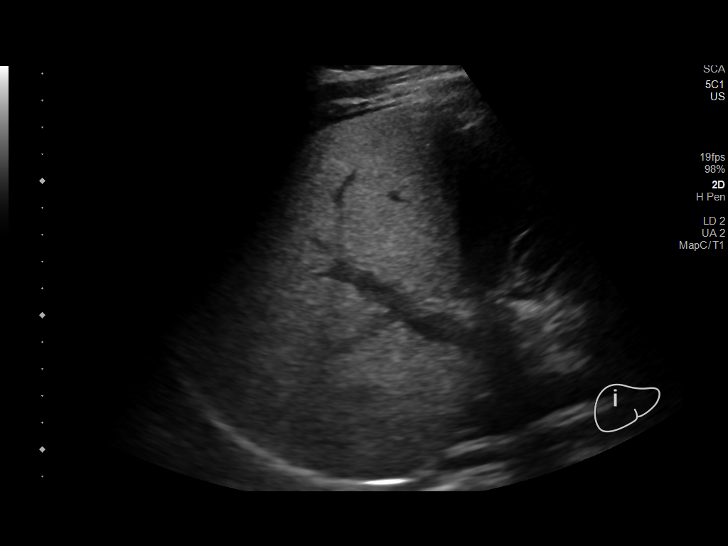
[im 30/66]
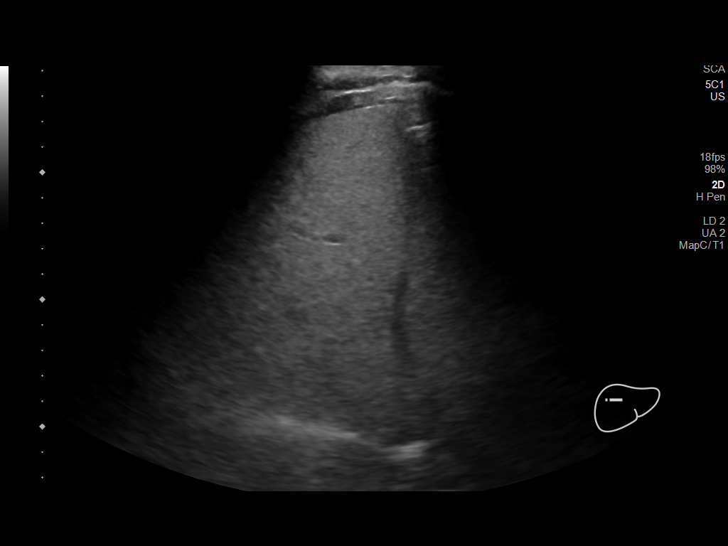
[im 36/66]
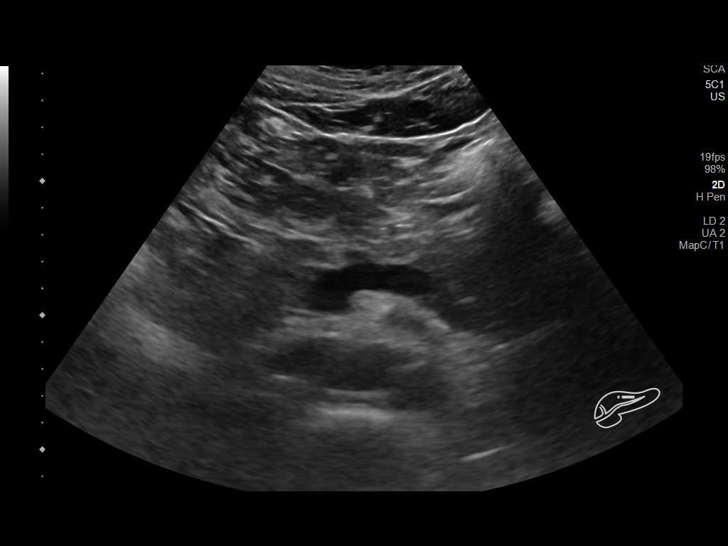
[im 41/66]
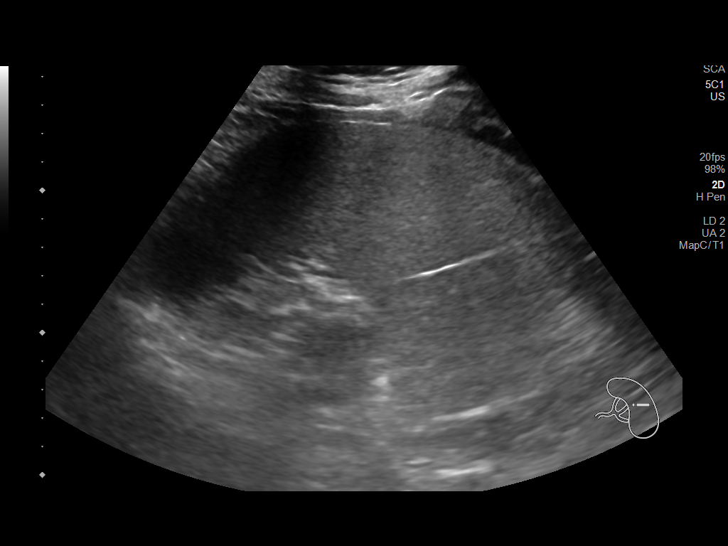
[im 44/66]
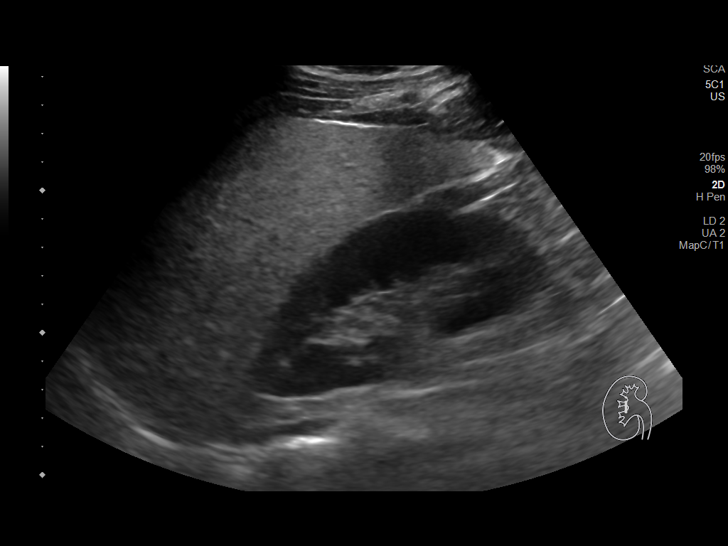
[im 49/66]
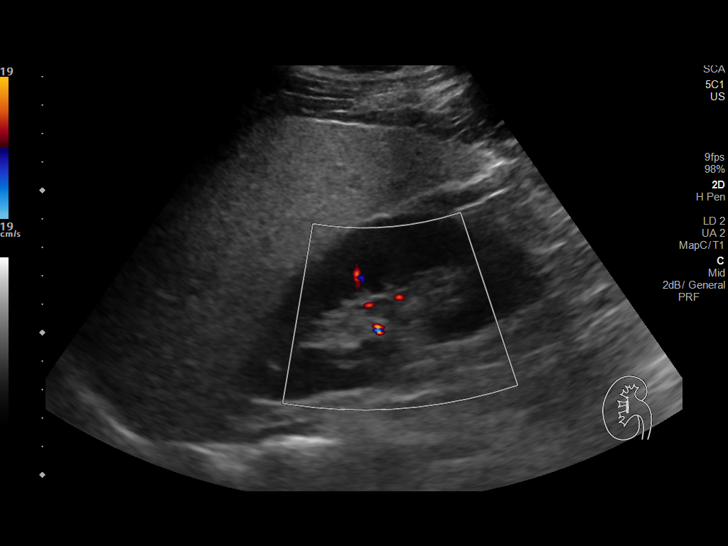
[im 55/66]
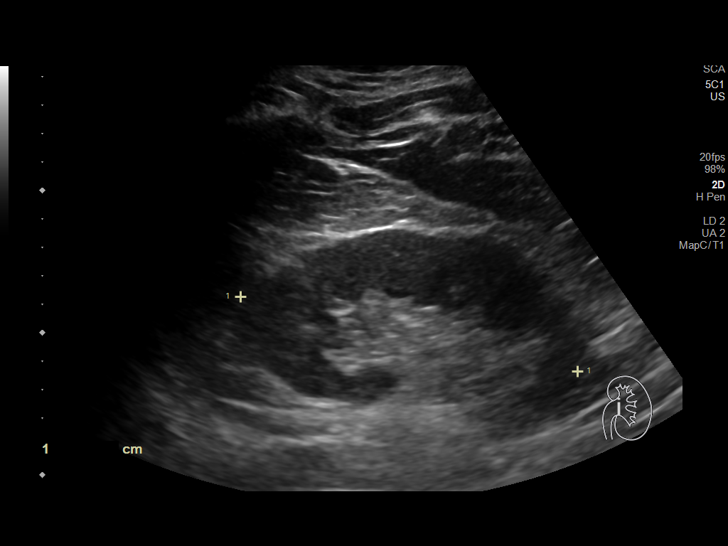
[im 60/66]
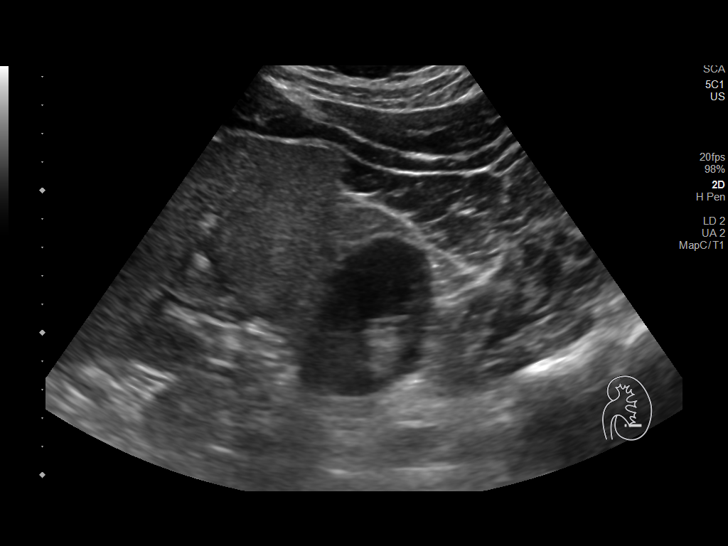
[im 66/66]
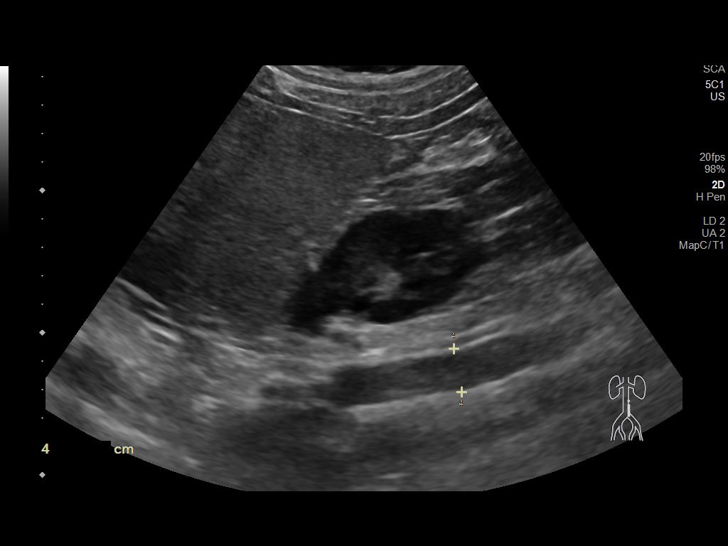

[14 of 25 positions shown; findings below may reference images not displayed]

FINDINGS: Gallbladder: No gallstones or wall thickening visualized. No
sonographic Murphy sign noted by sonographer.

Common bile duct: Diameter: 2.7 mm

Liver: Diffusely echogenic. No focal hepatic abnormality. Portal
vein is patent on color Doppler imaging with normal direction of
blood flow towards the liver.

IVC: No abnormality visualized.

Pancreas: Visualized portion unremarkable.

Spleen: Enlarged with volume of 958.5 mL.

Right Kidney: Length: 11.4 cm. Echogenicity within normal limits. No
hydronephrosis. Echogenic mass upper pole right kidney measuring 9
mm, suggestive of small angiomyolipoma, likely corresponding to a
small hypodense lesion on prior CT.

Left Kidney: Length: 12.1 cm. Echogenicity within normal limits. No
mass or hydronephrosis visualized.

Abdominal aorta: No aneurysm visualized.

Other findings: None.
IMPRESSION: 1. Echogenic liver consistent with steatosis and or hepatocellular
disease.
2. Splenomegaly
3. Stable small benign-appearing echogenic mass in the upper pole
right kidney

## 2023-02-05 ENCOUNTER — Inpatient Hospital Stay: Payer: PRIVATE HEALTH INSURANCE | Attending: Hematology & Oncology

## 2023-08-06 ENCOUNTER — Inpatient Hospital Stay: Payer: PRIVATE HEALTH INSURANCE | Attending: Hematology & Oncology

## 2023-08-06 ENCOUNTER — Other Ambulatory Visit: Payer: Self-pay

## 2023-08-06 ENCOUNTER — Inpatient Hospital Stay (HOSPITAL_BASED_OUTPATIENT_CLINIC_OR_DEPARTMENT_OTHER): Payer: PRIVATE HEALTH INSURANCE | Admitting: Hematology & Oncology

## 2023-08-06 ENCOUNTER — Encounter: Payer: Self-pay | Admitting: Hematology & Oncology

## 2023-08-06 ENCOUNTER — Other Ambulatory Visit: Payer: Self-pay | Admitting: *Deleted

## 2023-08-06 VITALS — BP 125/76 | HR 78 | Temp 98.5°F | Resp 18 | Ht 66.14 in | Wt 196.0 lb

## 2023-08-06 DIAGNOSIS — D580 Hereditary spherocytosis: Secondary | ICD-10-CM | POA: Diagnosis present

## 2023-08-06 DIAGNOSIS — D508 Other iron deficiency anemias: Secondary | ICD-10-CM

## 2023-08-06 LAB — CBC WITH DIFFERENTIAL (CANCER CENTER ONLY)
Abs Immature Granulocytes: 0.09 10*3/uL — ABNORMAL HIGH (ref 0.00–0.07)
Basophils Absolute: 0.1 10*3/uL (ref 0.0–0.1)
Basophils Relative: 1 %
Eosinophils Absolute: 0.2 10*3/uL (ref 0.0–0.5)
Eosinophils Relative: 2 %
HCT: 33.7 % — ABNORMAL LOW (ref 39.0–52.0)
Hemoglobin: 11.6 g/dL — ABNORMAL LOW (ref 13.0–17.0)
Immature Granulocytes: 1 %
Lymphocytes Relative: 26 %
Lymphs Abs: 2.6 10*3/uL (ref 0.7–4.0)
MCH: 29.6 pg (ref 26.0–34.0)
MCHC: 34.4 g/dL (ref 30.0–36.0)
MCV: 86 fL (ref 80.0–100.0)
Monocytes Absolute: 0.7 10*3/uL (ref 0.1–1.0)
Monocytes Relative: 7 %
Neutro Abs: 6.2 10*3/uL (ref 1.7–7.7)
Neutrophils Relative %: 63 %
Platelet Count: 155 10*3/uL (ref 150–400)
RBC: 3.92 MIL/uL — ABNORMAL LOW (ref 4.22–5.81)
RDW: 23.9 % — ABNORMAL HIGH (ref 11.5–15.5)
Smear Review: NORMAL
WBC Count: 9.8 10*3/uL (ref 4.0–10.5)
nRBC: 1.1 % — ABNORMAL HIGH (ref 0.0–0.2)

## 2023-08-06 LAB — CMP (CANCER CENTER ONLY)
ALT: 18 U/L (ref 0–44)
AST: 22 U/L (ref 15–41)
Albumin: 5.4 g/dL — ABNORMAL HIGH (ref 3.5–5.0)
Alkaline Phosphatase: 93 U/L (ref 38–126)
Anion gap: 9 (ref 5–15)
BUN: 12 mg/dL (ref 6–20)
CO2: 26 mmol/L (ref 22–32)
Calcium: 9.9 mg/dL (ref 8.9–10.3)
Chloride: 104 mmol/L (ref 98–111)
Creatinine: 0.81 mg/dL (ref 0.61–1.24)
GFR, Estimated: >60 mL/min (ref >60)
Glucose, Bld: 105 mg/dL — ABNORMAL HIGH (ref 70–99)
Potassium: 3.8 mmol/L (ref 3.5–5.1)
Sodium: 139 mmol/L (ref 135–145)
Total Bilirubin: 2.1 mg/dL — ABNORMAL HIGH (ref 0.3–1.2)
Total Protein: 7.6 g/dL (ref 6.5–8.1)

## 2023-08-06 LAB — SAVE SMEAR(SSMR), FOR PROVIDER SLIDE REVIEW

## 2023-08-06 LAB — LACTATE DEHYDROGENASE: LDH: 235 U/L — ABNORMAL HIGH (ref 98–192)

## 2023-08-06 LAB — FERRITIN: Ferritin: 118 ng/mL (ref 24–336)

## 2023-08-06 NOTE — Progress Notes (Signed)
Hematology and Oncology Follow Up Visit  Clarence Hines [id number] Jul 11, 1986 37 y.o. 08/06/2023   Principle Diagnosis:  Hereditary spherocytosis    Current Therapy:        Folic acid 5 mg PO daily   Interim History:  Clarence Hines is here today for follow-up.  He was last seen here about a year ago.  He is doing well.  He is on folic acid.  He is on high-dose of folic acid 5 mg a day.  He has had no problems with fatigue or weakness.  He is still working.  He wants to know if he could take some a over-the-counter iron.  I think that his iron saturation level might be a little on the higher side that taking iron might be a little bit risky.  He has had no cough or shortness of breath.  He has had no problems with COVID.  His last ultrasound of the spleen was back in 2022.  At that time, he had a large spleen that 962 cm.  I think it be wise to repeat evaluate that.  He has had no change in bowel or bladder habits.  He has had no leg swelling.  He has had no rashes.  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  Currently, I would say that his performance status is probably ECOG 0.   Medications:  Allergies as of 08/06/2023       Reactions   Sulfa Antibiotics Other (See Comments)   unknown        Medication List        Accurate as of August 06, 2023  2:38 PM. If you have any questions, ask your nurse or doctor.          B COMPLEX VITAMINS PO Take by mouth.   Culturelle Caps Take by mouth.   dexlansoprazole 60 MG capsule Commonly known as: Dexilant Take 1 capsule (60 mg total) by mouth every other day.   dicyclomine 20 MG tablet Commonly known as: BENTYL Take 1 tablet (20 mg total) by mouth every 6 (six) hours as needed (Abdominal cramping).   ethambutol 100 MG tablet Commonly known as: MYAMBUTOL Take by mouth daily. Unsure dose   folic acid 1 MG tablet Commonly known as: FOLVITE Take 5 tablets (5 mg total) by mouth daily.    isoniazid 100 MG tablet Commonly known as: NYDRAZID Take 100 mg by mouth daily. Unsure dose   metoprolol succinate 25 MG 24 hr tablet Commonly known as: TOPROL-XL Take 25 mg by mouth daily.   Mounjaro 2.5 MG/0.5ML Pen Generic drug: tirzepatide Inject 5 mg into the skin once a week.   ondansetron 8 MG disintegrating tablet Commonly known as: ZOFRAN-ODT Take 1 tablet (8 mg total) by mouth every 8 (eight) hours as needed for vomiting or nausea. 8mg  ODT q4 hours prn nausea   pyrazinamide 500 MG tablet Take 500 mg by mouth daily. Unsure dose   rifampin 300 MG capsule Commonly known as: RIFADIN Take 600 mg by mouth daily. Unsure dose   telmisartan 40 MG tablet Commonly known as: MICARDIS Take 80 mg by mouth daily.   venlafaxine XR 75 MG 24 hr capsule Commonly known as: EFFEXOR-XR Take 75 mg by mouth daily with breakfast.        Allergies:  Allergies  Allergen Reactions   Sulfa Antibiotics Other (See Comments)    unknown    Past Medical History, Surgical history, Social history, and Family  History were reviewed and updated.  Review of Systems: Review of Systems  Constitutional: Negative.   HENT: Negative.  Negative for hearing loss.   Eyes: Negative.   Respiratory: Negative.    Cardiovascular: Negative.   Gastrointestinal: Negative.   Genitourinary: Negative.   Musculoskeletal: Negative.   Skin: Negative.   Neurological: Negative.   Endo/Heme/Allergies: Negative.   Psychiatric/Behavioral: Negative.     Marland Kitchen   Physical Exam: Temperature 98.5.  Pulse 78.  Blood pressure 125/76.  Weight is 196 pounds.  Wt Readings from Last 3 Encounters:  08/04/22 188 lb (85.3 kg)  04/14/22 185 lb (83.9 kg)  01/04/22 196 lb 6.9 oz (89.1 kg)    Physical Exam Vitals reviewed.  HENT:     Head: Normocephalic and atraumatic.  Eyes:     Pupils: Pupils are equal, round, and reactive to light.  Cardiovascular:     Rate and Rhythm: Normal rate and regular rhythm.     Heart  sounds: Normal heart sounds.  Pulmonary:     Effort: Pulmonary effort is normal.     Breath sounds: Normal breath sounds.  Abdominal:     General: Bowel sounds are normal.     Palpations: Abdomen is soft.  Musculoskeletal:        General: No tenderness or deformity. Normal range of motion.     Cervical back: Normal range of motion.  Lymphadenopathy:     Cervical: No cervical adenopathy.  Skin:    General: Skin is warm and dry.     Findings: No erythema or rash.  Neurological:     Mental Status: He is alert and oriented to person, place, and time.  Psychiatric:        Behavior: Behavior normal.        Thought Content: Thought content normal.        Judgment: Judgment normal.      Lab Results  Component Value Date   WBC 13.2 (H) 08/04/2022   HGB 12.3 (L) 08/04/2022   HCT 35.7 (L) 08/04/2022   MCV 82.1 08/04/2022   PLT 200 08/04/2022   Lab Results  Component Value Date   FERRITIN 165 08/04/2022   IRON 173 08/04/2022   TIBC 340 08/04/2022   UIBC 167 08/04/2022   IRONPCTSAT 51 (H) 08/04/2022   Lab Results  Component Value Date   RETICCTPCT 13.8 (H) 08/04/2022   RBC 4.35 08/04/2022   RBC 4.35 08/04/2022   No results found for: "KPAFRELGTCHN", "LAMBDASER", "KAPLAMBRATIO" No results found for: "IGGSERUM", "IGA", "IGMSERUM" No results found for: "TOTALPROTELP", "ALBUMINELP", "A1GS", "A2GS", "BETS", "BETA2SER", "GAMS", "MSPIKE", "SPEI"   Chemistry      Component Value Date/Time   NA 137 08/04/2022 0836   NA 140 02/22/2017 0917   K 4.3 08/04/2022 0836   K 3.7 02/22/2017 0917   CL 103 08/04/2022 0836   CO2 26 08/04/2022 0836   CO2 25 02/22/2017 0917   BUN 16 08/04/2022 0836   BUN 11.2 02/22/2017 0917   CREATININE 0.93 08/04/2022 0836   CREATININE 0.9 02/22/2017 0917      Component Value Date/Time   CALCIUM 10.3 08/04/2022 0836   CALCIUM 9.4 02/22/2017 0917   ALKPHOS 101 08/04/2022 0836   ALKPHOS 84 02/22/2017 0917   AST 22 08/04/2022 0836   AST 27 02/22/2017  0917   ALT 32 08/04/2022 0836   ALT 34 02/22/2017 0917   BILITOT 1.5 (H) 08/04/2022 0836   BILITOT 2.64 (H) 02/22/2017 0981  Impression and Plan: Clarence Hines is a very pleasant 37 yo [place] gentleman with hereditary spherocytosis.   We will have to see what his reticulocyte count is.  We will also have to take a look at his spleen.  Will see about ultrasound of the spleen.  He really is not that anemic.  As such, we can just follow along right now.  I really do not think he needs any iron.  I will talk to him about this.  I will have him come back in 1 year to see me.  He will come back in 6 months for labs.  Clarence Macho, MD 11/1/20242:38 PM

## 2023-08-09 LAB — IRON AND IRON BINDING CAPACITY (CC-WL,HP ONLY)
Iron: 111 ug/dL (ref 45–182)
Saturation Ratios: 31 % (ref 17.9–39.5)
TIBC: 360 ug/dL (ref 250–450)
UIBC: 249 ug/dL (ref 117–376)

## 2023-08-12 ENCOUNTER — Telehealth (HOSPITAL_BASED_OUTPATIENT_CLINIC_OR_DEPARTMENT_OTHER): Payer: Self-pay

## 2023-09-01 ENCOUNTER — Encounter: Payer: Self-pay | Admitting: Hematology & Oncology

## 2023-09-01 ENCOUNTER — Other Ambulatory Visit: Payer: Self-pay | Admitting: *Deleted

## 2023-09-01 DIAGNOSIS — D580 Hereditary spherocytosis: Secondary | ICD-10-CM

## 2023-09-10 ENCOUNTER — Encounter (HOSPITAL_BASED_OUTPATIENT_CLINIC_OR_DEPARTMENT_OTHER): Payer: Self-pay

## 2023-09-10 ENCOUNTER — Ambulatory Visit (HOSPITAL_BASED_OUTPATIENT_CLINIC_OR_DEPARTMENT_OTHER): Admission: RE | Admit: 2023-09-10 | Payer: PRIVATE HEALTH INSURANCE | Source: Ambulatory Visit

## 2024-02-03 ENCOUNTER — Inpatient Hospital Stay: Payer: PRIVATE HEALTH INSURANCE | Attending: Family

## 2024-08-04 ENCOUNTER — Ambulatory Visit: Payer: PRIVATE HEALTH INSURANCE | Admitting: Hematology & Oncology

## 2024-08-04 ENCOUNTER — Other Ambulatory Visit: Payer: PRIVATE HEALTH INSURANCE
